# Patient Record
Sex: Female | Born: 1975 | Hispanic: No | Marital: Married | Smoking: Never smoker
Health system: Southern US, Community
[De-identification: ages and names within clinical notes are randomized; demographics above are authoritative.]

## PROBLEM LIST (undated history)

## (undated) DIAGNOSIS — D649 Anemia, unspecified: Secondary | ICD-10-CM

## (undated) DIAGNOSIS — O24419 Gestational diabetes mellitus in pregnancy, unspecified control: Secondary | ICD-10-CM

## (undated) DIAGNOSIS — Z789 Other specified health status: Secondary | ICD-10-CM

## (undated) DIAGNOSIS — I10 Essential (primary) hypertension: Secondary | ICD-10-CM

## (undated) DIAGNOSIS — Z30017 Encounter for initial prescription of implantable subdermal contraceptive: Secondary | ICD-10-CM

## (undated) HISTORY — DX: Encounter for initial prescription of implantable subdermal contraceptive: Z30.017

## (undated) HISTORY — DX: Anemia, unspecified: D64.9

## (undated) HISTORY — PX: NO PAST SURGERIES: SHX2092

## (undated) HISTORY — DX: Gestational diabetes mellitus in pregnancy, unspecified control: O24.419

---

## 1998-02-11 ENCOUNTER — Inpatient Hospital Stay (HOSPITAL_COMMUNITY): Admission: AD | Admit: 1998-02-11 | Discharge: 1998-02-15 | Payer: Self-pay | Admitting: *Deleted

## 1998-02-11 ENCOUNTER — Encounter (HOSPITAL_COMMUNITY): Admission: RE | Admit: 1998-02-11 | Discharge: 1998-02-13 | Payer: Self-pay | Admitting: *Deleted

## 2002-04-30 ENCOUNTER — Ambulatory Visit (HOSPITAL_COMMUNITY): Admission: RE | Admit: 2002-04-30 | Discharge: 2002-04-30 | Payer: Self-pay | Admitting: *Deleted

## 2002-08-17 ENCOUNTER — Emergency Department (HOSPITAL_COMMUNITY): Admission: EM | Admit: 2002-08-17 | Discharge: 2002-08-17 | Payer: Self-pay | Admitting: *Deleted

## 2002-09-15 ENCOUNTER — Inpatient Hospital Stay (HOSPITAL_COMMUNITY): Admission: AD | Admit: 2002-09-15 | Discharge: 2002-09-15 | Payer: Self-pay | Admitting: *Deleted

## 2002-09-17 ENCOUNTER — Inpatient Hospital Stay (HOSPITAL_COMMUNITY): Admission: AD | Admit: 2002-09-17 | Discharge: 2002-09-19 | Payer: Self-pay | Admitting: Obstetrics and Gynecology

## 2003-07-01 ENCOUNTER — Ambulatory Visit (HOSPITAL_COMMUNITY): Admission: RE | Admit: 2003-07-01 | Discharge: 2003-07-01 | Payer: Self-pay | Admitting: *Deleted

## 2003-08-25 ENCOUNTER — Ambulatory Visit (HOSPITAL_COMMUNITY): Admission: RE | Admit: 2003-08-25 | Discharge: 2003-08-25 | Payer: Self-pay | Admitting: *Deleted

## 2003-11-25 ENCOUNTER — Inpatient Hospital Stay (HOSPITAL_COMMUNITY): Admission: AD | Admit: 2003-11-25 | Discharge: 2003-11-27 | Payer: Self-pay | Admitting: *Deleted

## 2011-06-29 ENCOUNTER — Encounter (HOSPITAL_COMMUNITY): Payer: Self-pay | Admitting: *Deleted

## 2011-06-29 ENCOUNTER — Inpatient Hospital Stay (HOSPITAL_COMMUNITY)
Admission: AD | Admit: 2011-06-29 | Discharge: 2011-06-29 | Disposition: A | Discharge status: Home / Self Care | Payer: Self-pay | Source: Ambulatory Visit | Attending: Obstetrics & Gynecology | Admitting: Obstetrics & Gynecology

## 2011-06-29 ENCOUNTER — Inpatient Hospital Stay (HOSPITAL_COMMUNITY): Payer: Self-pay

## 2011-06-29 DIAGNOSIS — Z363 Encounter for antenatal screening for malformations: Secondary | ICD-10-CM | POA: Insufficient documentation

## 2011-06-29 DIAGNOSIS — Z8632 Personal history of gestational diabetes: Secondary | ICD-10-CM | POA: Insufficient documentation

## 2011-06-29 DIAGNOSIS — Z1389 Encounter for screening for other disorder: Secondary | ICD-10-CM | POA: Insufficient documentation

## 2011-06-29 DIAGNOSIS — O4690 Antepartum hemorrhage, unspecified, unspecified trimester: Secondary | ICD-10-CM | POA: Insufficient documentation

## 2011-06-29 DIAGNOSIS — O99891 Other specified diseases and conditions complicating pregnancy: Secondary | ICD-10-CM | POA: Insufficient documentation

## 2011-06-29 DIAGNOSIS — R109 Unspecified abdominal pain: Secondary | ICD-10-CM | POA: Insufficient documentation

## 2011-06-29 DIAGNOSIS — O9989 Other specified diseases and conditions complicating pregnancy, childbirth and the puerperium: Secondary | ICD-10-CM

## 2011-06-29 DIAGNOSIS — Z3490 Encounter for supervision of normal pregnancy, unspecified, unspecified trimester: Secondary | ICD-10-CM

## 2011-06-29 HISTORY — DX: Other specified health status: Z78.9

## 2011-06-29 LAB — URINALYSIS, ROUTINE W REFLEX MICROSCOPIC
Bilirubin Urine: NEGATIVE
Glucose, UA: NEGATIVE mg/dL
Ketones, ur: NEGATIVE mg/dL
Leukocytes, UA: NEGATIVE
Nitrite: NEGATIVE
Protein, ur: NEGATIVE mg/dL
Specific Gravity, Urine: 1.025 (ref 1.005–1.030)
Urobilinogen, UA: 0.2 mg/dL (ref 0.0–1.0)
pH: 6.5 (ref 5.0–8.0)

## 2011-06-29 LAB — CBC
HCT: 39.5 % (ref 36.0–46.0)
MCHC: 35.7 g/dL (ref 30.0–36.0)
MCV: 88.2 fL (ref 78.0–100.0)
RDW: 12.6 % (ref 11.5–15.5)

## 2011-06-29 LAB — URINE MICROSCOPIC-ADD ON

## 2011-06-29 LAB — POCT PREGNANCY, URINE: Preg Test, Ur: POSITIVE

## 2011-06-29 MED ORDER — OXYCODONE-ACETAMINOPHEN 5-325 MG PO TABS
1.0000 | ORAL_TABLET | Freq: Once | ORAL | Status: AC
  Administered 2011-06-29: 1 via ORAL
  Filled 2011-06-29: qty 1

## 2011-06-29 NOTE — Progress Notes (Signed)
+  UPT at [facility] on 7/18, had bleeding with wiping yesterday, now is dark brown.  LLQ pain since last night, intermittent, cramping.

## 2011-06-29 NOTE — ED Notes (Signed)
Unable to obtain FHR by either doppler or EFM.  Pt has letter from [facility] stating she had pos preg test on 7/18.  Pt unsure of LMP.

## 2011-06-29 NOTE — ED Notes (Signed)
Dr. Natale Milch in to discuss results with pt using [employer] interpreter [phone].

## 2011-06-29 NOTE — ED Provider Notes (Signed)
History     Chief Complaint  Patient presents with  . Vaginal Bleeding   Vaginal Bleeding This is a new problem. The current episode started yesterday. The problem has been gradually improving. The pain is moderate. The problem affects the left side. She is pregnant. Associated symptoms include abdominal pain. Pertinent negatives include no anorexia, back pain, chills, dysuria, flank pain, headaches or painful intercourse.    Past Medical History  Diagnosis Date  . No pertinent past medical history     Past Surgical History  Procedure Date  . No past surgeries     No family history on file.  History  Substance Use Topics  . Smoking status: Never Smoker   . Smokeless tobacco: Not on file  . Alcohol Use: No    Allergies: Allergies not on file  No prescriptions prior to admission    Review of Systems  Constitutional: Negative.  Negative for chills.  HENT: Negative.   Eyes: Negative.   Respiratory: Negative.   Cardiovascular: Negative.   Gastrointestinal: Positive for abdominal pain. Negative for anorexia.  Genitourinary: Negative.  Negative for dysuria and flank pain.  Musculoskeletal: Negative.  Negative for back pain.  Skin: Negative.   Neurological: Negative.  Negative for headaches.  Endo/Heme/Allergies: Negative.   Psychiatric/Behavioral: Negative.    Physical Exam   Blood pressure 126/79, pulse 80, temperature 98.6 F (37 C), temperature source Oral, resp. rate 18, height 5' (1.524 m), weight 165 lb 6.4 oz (75.025 kg), last menstrual period 01/11/2011.  Physical Exam  Constitutional: She is oriented to person, place, and time. She appears well-developed and well-nourished.  HENT:  Head: Normocephalic and atraumatic.  Eyes: Pupils are equal, round, and reactive to light.  Neck: Normal range of motion.  Cardiovascular: Normal rate, regular rhythm and intact distal pulses.  Exam reveals no gallop and no friction rub.   Murmur heard. Respiratory: Effort  normal and breath sounds normal. No respiratory distress. She has no wheezes.  GI: Soft. Bowel sounds are normal. She exhibits no distension and no mass. There is tenderness. There is guarding. There is no rebound.  Genitourinary: There is no rash, tenderness, lesion or injury on the right labia. There is no rash, tenderness, lesion or injury on the left labia. Uterus is tender. Cervix exhibits discharge. Cervix exhibits no motion tenderness and no friability. Right adnexum displays no tenderness. Left adnexum displays tenderness. There is bleeding around the vagina.  Musculoskeletal: Normal range of motion.  Neurological: She is alert and oriented to person, place, and time. She has normal reflexes.  Skin: Skin is warm and dry. No rash noted. She is not diaphoretic.  Psychiatric: She has a normal mood and affect.    MAU Course  Procedures  Results for orders placed during the hospital encounter of 06/29/11 (from the past 24 hour(s))  URINALYSIS, ROUTINE W REFLEX MICROSCOPIC     Status: Abnormal   Collection Time   06/29/11  2:15 PM      Component Value Range   Color, Urine YELLOW  YELLOW    Appearance CLEAR  CLEAR    Specific Gravity, Urine 1.025  1.005 - 1.030    pH 6.5  5.0 - 8.0    Glucose, UA NEGATIVE  NEGATIVE (mg/dL)   Hgb urine dipstick LARGE (*) NEGATIVE    Bilirubin Urine NEGATIVE  NEGATIVE    Ketones, ur NEGATIVE  NEGATIVE (mg/dL)   Protein, ur NEGATIVE  NEGATIVE (mg/dL)   Urobilinogen, UA 0.2  0.0 - 1.0 (  mg/dL)   Nitrite NEGATIVE  NEGATIVE    Leukocytes, UA NEGATIVE  NEGATIVE   URINE MICROSCOPIC-ADD ON     Status: Abnormal   Collection Time   06/29/11  2:15 PM      Component Value Range   Squamous Epithelial / LPF FEW (*) RARE    RBC / HPF 0-2  <3 (RBC/hpf)   Bacteria, UA FEW (*) RARE   POCT PREGNANCY, URINE     Status: Normal   Collection Time   06/29/11  3:29 PM      Component Value Range   Preg Test, Ur POSITIVE    HCG, QUANTITATIVE, PREGNANCY     Status: Abnormal    Collection Time   06/29/11  3:43 PM      Component Value Range   hCG, Beta Chain, Sharene Butters, [place] [address] (*) <5 (mIU/mL)  CBC     Status: Abnormal   Collection Time   06/29/11  3:43 PM      Component Value Range   WBC 9.6  4.0 - 10.5 (K/uL)   RBC 4.48  3.87 - 5.11 (MIL/uL)   Hemoglobin 14.1  12.0 - 15.0 (g/dL)   HCT 60.4  54.0 - 98.1 (%)   MCV 88.2  78.0 - 100.0 (fL)   MCH 31.5  26.0 - 34.0 (pg)   MCHC 35.7  30.0 - 36.0 (g/dL)   RDW 19.1  47.8 - 29.5 (%)   Platelets 148 (*) 150 - 400 (K/uL)  ABO/RH     Status: Normal   Collection Time   06/29/11  3:44 PM      Component Value Range   ABO/RH(D) O POS    WET PREP, GENITAL     Status: Abnormal   Collection Time   06/29/11  3:50 PM      Component Value Range   Yeast, Wet Prep NONE SEEN  NONE SEEN    Trich, Wet Prep NONE SEEN  NONE SEEN    Clue Cells, Wet Prep NONE SEEN  NONE SEEN    WBC, Wet Prep HPF POC MODERATE (*) NONE SEEN     Microscopic wet-mount exam shows white blood cells.  Assessment and Plan  1. IUP at 11 wks per US performed today, with [facility] 01/12/2012. 2. Vaginal Bleeding during pregnancy 3. 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 Radiology reassured me that no placental abnormalities were seen on [place]. Pt to cont with plans to speak to HD regarding appointment for routine care, and I stressed through interpretor that she needs to be sure to tell HD that she has history of GDM and will need an early screen. Pt v/u of this but expressed her concern that she has had difficulty getting into contact with the HD. I left a message for our clinic manager to remind me on Monday to call them myself. Pt v/u and appreciates this.  Jade Potts 06/29/2011, 3:43 PM

## 2011-06-30 LAB — GC/CHLAMYDIA PROBE AMP, GENITAL: Chlamydia, DNA Probe: NEGATIVE

## 2011-07-11 ENCOUNTER — Ambulatory Visit (INDEPENDENT_AMBULATORY_CARE_PROVIDER_SITE_OTHER): Payer: Self-pay | Admitting: Advanced Practice Midwife

## 2011-07-11 DIAGNOSIS — R82998 Other abnormal findings in urine: Secondary | ICD-10-CM

## 2011-07-11 DIAGNOSIS — Z8632 Personal history of gestational diabetes: Secondary | ICD-10-CM

## 2011-07-11 DIAGNOSIS — Z331 Pregnant state, incidental: Secondary | ICD-10-CM

## 2011-07-11 DIAGNOSIS — O24419 Gestational diabetes mellitus in pregnancy, unspecified control: Secondary | ICD-10-CM | POA: Insufficient documentation

## 2011-07-11 DIAGNOSIS — R8271 Bacteriuria: Secondary | ICD-10-CM | POA: Insufficient documentation

## 2011-07-11 LAB — POCT URINALYSIS DIP (DEVICE)
Glucose, UA: NEGATIVE mg/dL
Nitrite: NEGATIVE
Urobilinogen, UA: 1 mg/dL (ref 0.0–1.0)

## 2011-07-11 NOTE — Patient Instructions (Signed)
Embarazo - Segundo trimestre (Pregnancy - Second Trimester) El segundo trimestre del embarazo (del 3 al 6mes) es un perodo de evolucin rpida para usted y el beb. Hacia el final del sexto mes, el beb mide aproximadamente 23 cm y pesa 680 g. Comenzar a sentir los movimientos del beb entre las 18 y las 20 semanas de embarazo. Podr sentir las pataditas ("quickening en ingls"). Hay un rpido aumento de peso. Puede segregar un lquido claro (calostro) de las mamas. Quizs sienta pequeas contracciones en el vientre (tero) Esto se conoce como falso trabajo de parto o contracciones de Braxton-Hicks. Es como una prctica del trabajo de parto que se produce cuando el beb est listo para salir. Generalmente los problemas de vmitos matinales ya se han superado hacia el final del primer trimestre. Algunas mujeres desarrollan pequeas manchas oscuras (que se denominan cloasma, mscara del embarazo) en la cara que normalmente se van luego del nacimiento del beb. La exposicin al sol empeora las manchas. Puede desarrollarse acn en algunas mujeres embarazadas, y puede desaparecer en aquellas que ya tienen acn. EXAMENES PRENATALES  Durante los exmenes prenatales, deber seguir realizando pruebas de sangre, segn avance el embarazo. Estas pruebas se realizan para controlar su salud y la del beb. Tambin se realizan anlisis de sangre para conocer los niveles de hemoglobina. La anemia (bajo nivel de hemoglobina) es frecuente durante el embarazo. Para prevenirla, se administran hierro y vitaminas. Tambin se le realizarn exmenes para saber si tiene diabetes entre las 24 y las 28 semanas del embarazo. Podrn repetirle algunas de las pruebas que le hicieron previamente.  En cada visita le medirn el tamao del tero. Esto se realiza para asegurarse de que el beb est creciendo correctamente de acuerdo al estado del embarazo.  Tambin en cada visita prenatal controlarn su presin arterial. Esto se realiza  para asegurarse de que no tenga toxemia.  Se controlar su orina para asegurarse de que no tenga infecciones, diabetes o protena en la orina.  Se controlar su peso regularmente para asegurarse que el aumento ocurre al ritmo indicado. Esto se hace para asegurarse que usted y el beb tienen una evolucin normal.  En algunas ocasiones se realiza una prueba de ultrasonido para confirmar el correcto desarrollo y evolucin del beb. Esta prueba se realiza con ondas sonoras inofensivas para el beb, de modo que el profesional pueda calcular ms precisamente la fecha del parto. Algunas veces se realizan pruebas especializadas del lquido amnitico que rodea al beb. Esta prueba se denomina amniocentesis. El lquido amnitico se obtiene introduciendo una aguja en el vientre (abdomen). Se realiza para controlar los cromosomas en aquellos casos en los que existe alguna preocupacin acerca de algn problema gentico que pueda sufrir el beb. En ocasiones se lleva a cabo cerca del final del embarazo, si es necesario inducir al parto. En este caso se realiza para asegurarse que los pulmones del beb estn lo suficientemente maduros como para que pueda vivir fuera del tero. CAMBIOS QUE OCURREN EN EL SEGUNDO TRIMESTRE DEL EMBARAZO Su organismo atravesar numerosos cambios durante el embarazo. Estos pueden variar de una persona a otra. Converse con el profesional que la asiste acerca los cambios que usted note y que la preocupen.  Durante el segundo trimestre probablemente sienta un aumento del apetito. Es normal tener "antojos" de ciertas comidas. Esto vara de una persona a otra y de un embarazo a otro.  El abdomen inferior comenzar a abultarse.  Podr tener la necesidad de orinar con ms frecuencia debido a que   el tero y el beb presionan sobre la vejiga. Tambin es frecuente contraer ms infecciones urinarias durante el embarazo (dolor al orinar). Puede evitarlas bebiendo gran cantidad de lquidos y vaciando  la vejiga antes y despus de mantener relaciones sexuales.  Podrn aparecer las primeras estras en las caderas, abdomen y mamas. Estos son cambios normales del cuerpo durante el embarazo. No existen medicamentos ni ejercicios que puedan prevenir estos cambios.  Es posible que comience a desarrollar venas inflamadas y abultadas (varices) en las piernas. El uso de medias de descanso, elevar sus pies durante 15 minutos, 3 a 4 veces al da y limitar la sal en su dieta ayuda a aliviar el problema.  Podr sentir acidez gstrica a medida que el tero crece y presiona contra el estmago. Puede tomar anticidos, con la autorizacin de su mdico, para aliviar este problema. Tambin es til ingerir pequeas comidas 4 a 5 veces al da.  La constipacin puede tratarse con un laxante o agregando fibra a su dieta. Beber grandes cantidades de lquidos, comer vegetales, frutas y granos integrales es de gran ayuda.  Tambin es beneficioso practicar actividad fsica. Si ha sido una persona activa hasta el embarazo, podr continuar con la mayora de las actividades durante el mismo. Si ha sido menos activa, puede ser beneficioso que comience con un programa de ejercicios, como realizar caminatas.  Puede desarrollar hemorroides (vrices en el recto) hacia el final del segundo trimestre. Tomar baos de asiento tibios y utilizar cremas recomendadas por el profesional que lo asiste sern de ayuda para los problemas de hemorroides.  Tambin podr sentir dolor de espalda durante este momento de su embarazo. Evite levantar objetos pesados, utilice zapatos de taco bajo y mantenga una buena postura para ayudar a reducir los problemas de espalda.  Algunas mujeres embarazadas desarrollan hormigueo y adormecimiento de la mano y los dedos debido a la hinchazn y compresin de los ligamentos de la mueca (sndrome del tnel carpiano). Esto desaparece una vez que el beb nace.  Como sus pechos se agrandan, necesitar un sujetador  ms grande. Use un sostn de soporte, cmodo y de algodn. No utilice un sostn para amamantar hasta el ltimo mes de embarazo si va a amamantar al beb.  Podr observar una lnea oscura desde el ombligo hacia la zona pbica denominada linea nigra.  Podr observar que sus mejillas se ponen coloradas debido al aumento de flujo sanguneo en la cara.  Podr desarrollar "araitas" en la cara, cuello y pecho. Esto desaparece una vez que el beb nace. INSTRUCCIONES PARA EL CUIDADO DOMICILIARIO  Es extremadamente importante que evite el cigarrillo, hierbas medicinales, alcohol y las drogas no prescriptas durante el embarazo. Estas sustancias qumicas afectan la formacin y el desarrollo del beb. Evite estas sustancias durante todo el embarazo para asegurar el nacimiento de un beb sano.  La mayor parte de los cuidados que se aconsejan son los mismos que los indicados para el primer trimestre del embarazo. Cumpla con las citas tal como se le indic. Siga las instrucciones del profesional que lo asiste con respecto al uso de los medicamentos, el ejercicio y la dieta.  Durante el embarazo debe obtener nutrientes para usted y para su beb. Consuma alimentos balanceados a intervalos regulares. Elija alimentos como carne, pescado, leche y otros productos lcteos descremados, vegetales, frutas, panes integrales y cereales. El profesional le informar cul es el aumento de peso ideal.  Las relaciones sexuales fsicas pueden continuarse hasta cerca del fin del embarazo si no existen otros problemas. Estos   problemas pueden ser la prdida temprana (prematura) de lquido amnitico de las membranas, sangrado vaginal, dolor abdominal u otros problemas mdicos o del embarazo.  Realice actividad fsica todos los das, si no tiene restricciones. Consulte con el profesional que la asiste si no sabe con certeza si determinados ejercicios son seguros. El mayor aumento de peso tiene lugar durante los ltimos 2 trimestres del  embarazo. El ejercicio la ayudar a:  Controlar su peso.  Ponerla en forma para el parto.  Ayudarla a perder peso luego de haber dado a luz.  Use un buen sostn o como los que se usan para hacer deportes para aliviar la sensibilidad de las mamas. Tambin puede serle til si lo usa mientras duerme. Si pierde calostro, podr utilizar apsitos en el sostn.  No utilice la baera con agua caliente, baos turcos y saunas durante el embarazo.  Utilice el cinturn de seguridad sin excepcin cuando conduzca. Este la proteger a usted y al beb en caso de accidente.  Evite comer carne cruda, queso crudo, y el contacto con los utensilios y desperdicios de los gatos. Estos elementos contienen grmenes que pueden causar defectos de nacimiento en el beb.  El segundo trimestre es un buen momento para visitar a su dentista y evaluar su salud dental si an no lo ha hecho. Es importante mantener los dientes limpios. Utilice un cepillo de dientes blando. Cepllese ms suavemente durante el embarazo.  Es ms fcil perder algo de orina durante el embarazo. Apretar y fortalecer los msculos de la pelvis la ayudar con este problema. Practique detener la miccin cuando est en el bao. Estos son los mismos msculos que necesita fortalecer. Son tambin los mismos msculos que utiliza cuando trata de evitar los gases. Puede practicar apretando estos msculos 10 veces, y repetir esto 3 veces por da aproximadamente. Una vez que conozca qu msculos debe apretar, no realice estos ejercicios durante la miccin. Puede favorecerle una infeccin si la orina vuelve hacia atrs.  Pida ayuda si tiene necesidades econmicas, de asesoramiento o nutricionales durante el embarazo. El profesional podr ayudarla con respecto a estas necesidades, o derivarla a otros especialistas.  La piel puede ponerse grasa. Si esto sucede, lvese la cara con un jabn suave, utilice un humectante no graso y maquillaje con base de aceite o  crema. CONSUMO DE MEDICAMENTOS Y DROGAS DURANTE EL EMBARAZO  Contine tomando las vitaminas apropiadas para esta etapa tal como se le indic. Las vitaminas deben contener un miligramo de cido flico y deben suplementarse con hierro. Guarde todas las vitaminas fuera del alcance de los nios. La ingestin de slo un par de vitaminas o tabletas que contengan hierro puede ocasionar la muerte en un beb o en un nio pequeo.  Evite el uso de medicamentos, inclusive los de venta libre y hierbas que no hayan sido prescriptos o indicados por el profesional que la asiste. Algunos medicamentos pueden causar problemas fsicos al beb. Utilice los medicamentos de venta libre o de prescripcin para el dolor, el malestar o la fiebre, segn se lo indique el profesional que lo asiste. No utilice aspirina.  El consumo de alcohol est relacionado con ciertos defectos de nacimiento. Esto incluye el sndrome de alcoholismo fetal. Debe evitar el consumo de alcohol en cualquiera de sus formas. El cigarrillo causa nacimientos prematuros y bebs de bajo peso. El uso de drogas recreativas est absolutamente prohibido. Son muy nocivas para el beb. Un beb que nace de una madre adicta, ser adicto al nacer. Ese beb tendr los mismos   causa nacimientos prematuros y bebs de [place]. El uso de drogas recreativas est absolutamente prohibido. Son muy nocivas para el beb. Un beb que nace de [employer], ser adicto al nacer. Ese beb tendr los mismos sntomas de abstinencia que un adulto.   Infrmele al profesional si consume alguna droga.   No consuma drogas ilegales. Pueden causarle mucho dao al beb.  SOLICITE ATENCIN MDICA SI:  Tiene preguntas o preocupaciones durante su embarazo. Es mejor que llame para Science writer las dudas que esperar hasta su prxima visita prenatal. Thressa Sheller forma se sentir ms tranquila.  SOLICITE ATENCIN MDICA DE INMEDIATO SI:  La temperatura oral se eleva sin motivo por encima de 101.0 o segn le indique el profesional que lo asiste.   Tiene una prdida de lquido por la vagina (canal de parto). Si sospecha una ruptura de las [place], tmese la temperatura y  llame al profesional para informarlo sobre esto.   Observa unas pequeas manchas, una hemorragia vaginal o elimina cogulos. Notifique al profesional acerca de la cantidad y de cuntos apsitos est utilizando. Unas pequeas manchas de sangre son algo comn durante el Psychiatrist, especialmente despus de Sales promotion account executive.   Presenta un olor desagradable en la secrecin vaginal y observa un cambio en el color, de transparente a blanco.   Contina con las nuseas y no obtiene alivio de los remedios indicados. Vomita sangre o algo similar a la borra del caf.   [place] o sube ms de 900 g. en una semana, o segn lo indicado por el profesional que la asiste.   Observa que se le [employer], las manos, los pies o las piernas.   Ha estado expuesta a la rubola y no ha sufrido la enfermedad.   Ha estado expuesta a la quinta enfermedad o a la varicela.   Presenta dolor abdominal. Las molestias en el ligamento redondo son Neomia Dear causa no cancerosa (benigna) frecuente de dolor abdominal durante el embarazo. El profesional que la asiste deber evaluarla.   Presenta dolor de cabeza intenso que no se [place].   Presenta fiebre, diarrea, dolor al orinar o le falta la respiracin.   Presenta dificultad para ver, visin borrosa, o visin doble.   Sufre una cada, un accidente de trnsito o cualquier tipo de trauma.   Vive en un hogar en el que existe violencia fsica o mental.  Document Released: 09/05/2005 Document Re-Released: 02/20/2010 [facility] Patient Information 2011 [place], [place].

## 2011-07-11 NOTE — Progress Notes (Signed)
   Subjective:    Jade Potts is a [id number] Unknown being seen today for her first obstetrical visit.  Her obstetrical history is significant for GDM with first pregnancy. Pregnancy history fully reviewed.  Patient is without complaints. Normal pap last year.   Filed Vitals:   07/11/11 0825 07/11/11 0829  BP: 122/83   Temp: 97.4 F (36.3 C)   Height:  5' 0.75" (1.543 m)  Weight: 165 lb 12.8 oz (75.206 kg)     HISTORY: OB History    Grav Para Term Preterm Abortions TAB SAB Ect Mult Living   4 3 3  0 0 0 0 0 0 3     # Outc Date GA Lbr Len/2nd Wgt Sex Del Anes PTL Lv   1 TRM 3/99 [redacted]w[redacted]d 18:00 6lb11oz(3.033kg) M VAC EPI No Yes   Comments: Induced/ Pt ran high fever   2 TRM 11/03 [redacted]w[redacted]d 08:00 8lb(3.629kg) M SVD EPI No Yes   3 TRM 12/04 [redacted]w[redacted]d 05:00 7lb(3.175kg) F SVD EPI No Yes   4 CUR              Past Medical History  Diagnosis Date  . No pertinent past medical history   . Gestational diabetes     1st and 2nd pregnancy  . Anemia     1st pregnancy   History reviewed. No pertinent past surgical history. Family History  Problem Relation Age of Onset  . Diabetes Mother   . Hypertension Sister      Exam    Uterine Size: size equals dates  Pelvic Exam: Completed during recent MAU visit  System:     Skin: normal coloration and turgor, no rashes    Neurologic: oriented, normal   Extremities: normal strength, tone, and muscle mass   Cardiovascular: regular rate and rhythm   Respiratory:  appears well, vitals normal, no respiratory distress, acyanotic, normal RR, neck free of mass or lymphadenopathy, chest clear, no wheezing, crepitations, rhonchi, normal symmetric air entry   Abdomen: soft, non-tender; bowel sounds normal; no masses,  no organomegaly      Assessment:    Pregnancy: [id number] Patient Active Problem List  Diagnoses  . Normal pregnancy  . Hx gestational diabetes  . Asymptomatic bacteriuria        Plan:    35 y.o.[id number] at [redacted]w[redacted]d  Initial labs  drawn, early 1 hour GCT today Prenatal vitamins. Problem list reviewed and updated. Genetic Screening discussed Quad Screen: undecided.  Ultrasound discussed; fetal survey: to be scheduled at approprate EGA.  Follow up in 4 weeks.   Jade Potts 07/11/2011

## 2011-07-11 NOTE — Progress Notes (Signed)
Pt came to MAU on the 20th for bleeding. No bleeding at this time. No vaginal discharge. Used interpreter Roxanna Mew.

## 2011-07-12 LAB — OBSTETRIC PANEL
Antibody Screen: NEGATIVE
Basophils Relative: 0 % (ref 0–1)
HCT: 43.5 % (ref 36.0–46.0)
Hemoglobin: 14.4 g/dL (ref 12.0–15.0)
MCH: 31 pg (ref 26.0–34.0)
MCHC: 33.1 g/dL (ref 30.0–36.0)
Monocytes Absolute: 0.3 10*3/uL (ref 0.1–1.0)
Monocytes Relative: 4 % (ref 3–12)
Neutro Abs: 5.2 10*3/uL (ref 1.7–7.7)
Rh Type: POSITIVE
Rubella: >500 IU/mL — ABNORMAL HIGH

## 2011-07-12 LAB — HIV ANTIBODY (ROUTINE TESTING W REFLEX): HIV: NONREACTIVE

## 2011-07-13 LAB — CULTURE, OB URINE

## 2011-07-16 LAB — HEMOGLOBINOPATHY EVALUATION
Hemoglobin Other: 0 % (ref 0.0–0.0)
Hgb A: 97 % (ref 96.8–97.8)

## 2011-08-08 ENCOUNTER — Ambulatory Visit (INDEPENDENT_AMBULATORY_CARE_PROVIDER_SITE_OTHER): Payer: Self-pay | Admitting: Obstetrics and Gynecology

## 2011-08-08 VITALS — BP 110/75 | Temp 97.7°F | Wt 165.7 lb

## 2011-08-08 DIAGNOSIS — Z3480 Encounter for supervision of other normal pregnancy, unspecified trimester: Secondary | ICD-10-CM

## 2011-08-08 LAB — POCT URINALYSIS DIP (DEVICE)
Hgb urine dipstick: NEGATIVE
Ketones, ur: NEGATIVE mg/dL
Specific Gravity, Urine: 1.025 (ref 1.005–1.030)
pH: 7.5 (ref 5.0–8.0)

## 2011-08-08 NOTE — Progress Notes (Signed)
This patient has no complaints except continual nausea almost every day. This seems to be helped by sucking on lemon. As of the time this is not accompanied by vomiting. When it is is usually in the morning and just green mucus. The patient has been encouraged to take something dry by mouth in the morning before she takes a liquids. Was a gestational diabetic with her first 2 pregnancies but not with her last period she has no other complaints and will return in one month

## 2011-08-08 NOTE — Progress Notes (Signed)
Pulse-68.  Vaginal discharge white.

## 2011-09-05 ENCOUNTER — Ambulatory Visit (INDEPENDENT_AMBULATORY_CARE_PROVIDER_SITE_OTHER): Payer: Self-pay | Admitting: Family Medicine

## 2011-09-05 DIAGNOSIS — Z331 Pregnant state, incidental: Secondary | ICD-10-CM

## 2011-09-05 DIAGNOSIS — B373 Candidiasis of vulva and vagina: Secondary | ICD-10-CM

## 2011-09-05 LAB — POCT URINALYSIS DIP (DEVICE)
Bilirubin Urine: NEGATIVE
Glucose, UA: NEGATIVE mg/dL
Hgb urine dipstick: NEGATIVE
Ketones, ur: NEGATIVE mg/dL
Nitrite: NEGATIVE
Protein, ur: NEGATIVE mg/dL
Specific Gravity, Urine: 1.02 (ref 1.005–1.030)
Urobilinogen, UA: 0.2 mg/dL (ref 0.0–1.0)
pH: 8 (ref 5.0–8.0)

## 2011-09-05 MED ORDER — FLUCONAZOLE 150 MG PO TABS: 150.0000 mg | ORAL_TABLET | Freq: Once | ORAL | Status: AC

## 2011-09-05 NOTE — Progress Notes (Signed)
C/o of vaginal itch.

## 2011-09-05 NOTE — Patient Instructions (Signed)
Infeccin por cndida en adultos (Candida Infection In Adults) Una infeccin por Cndida (tambin denominada levadura, hongo y Monilia) es un crecimiento excesivo de levadura que puede ocurrir en cualquier parte del cuerpo. Una infeccin por levaduras ocurre comnmente en zonas clidas y hmedas del cuerpo. Por lo general, la infeccin permanece localizada, pero puede extenderse a convertirse en una infeccin sistmica. Una infeccin de la levadura puede ser un signo de una enfermedad ms grave como la diabetes, la leucemia o el SIDA. Una infeccin por hongos puede que puede estar presente en mujeres y hombres. En mujeres, la vaginitis Cndida es una infeccin vaginal. Se trata de una de las causas ms frecuentes de la vaginitis. Los hombres no suelen tener sntomas [employer] se le aparecen otros problemas. Pueden descubrir que tienen una infeccin por hongos porque su compaera sexual los tiene. Es ms probable que los hombres no circuncisos adquieran una infeccin por hongo que aquellos que estn circuncindados. Esto se debe a que el glande est expuesto al aire y esta seco. CAUSAS Mujeres  Antibiticos.  Medicamento con esteroides Network engineer.   Tener sobrepeso (obesidad).   Diabetes.   Sistema inmune deficiente.   Ciertas enfermedades.   Medicamentos inmunosupresores para pacientes con trasplante de rganos.  Quimioterapia.   El Prien.   Menstruacin.   Estrs o fatiga.   El uso de drogas por va intravenosa.   Anticonceptivos orales.  Utilizar ropa ajustada en la zona de la entrepierna.   Contagio a partir de un compaero sexual que ya tiene la infeccin.   Los espermicidas.   Intravenosa, urinarias o catteres otros.   Hombres  Contagio a partir de una compaera sexual que ya tiene la infeccin.  Tener sexo oral o anal con una persona que tiene la infeccin.   Los espermicidas.   Diabetes.  Antibiticos.   Sistema inmune deficiente.   Medicamentos  que suprimen el sistema inmune.   El uso de drogas por va intravenosa.  Intravenosa, urinarias o catteres otros.   SNTOMAS Mujeres  Flujo vaginal espeso y blanco.  Picazn vaginal.   Enrojecimiento e hinchazn en la zona de la vagina.   Irritacin de los labios de la vagina y perineo.   lceras en labios vaginales y perineo.  Relaciones sexuales dolorosas.   Bajo nivel de Banker (hipoglucemia).   Dolor al Beatrix Shipper.   Infecciones en la vejiga.  Problemas intestinales como constipacin, indigestin, mal aliento, hinchazn, gases, diarrea o heces blandas.   Hombres  Primero los hombres desarrollan problemas intestinales como constipacin, indigestin, mal aliento, hinchazn, gases, diarrea o heces blandas.  Piel del pene seca y [place] con picazn o molestias.  Prurito de Estate agent.   Piel seca y escamosa.   Pie de atleta.  Hipoglucemia.   DIAGNSTICO Mujeres  Se revisa el historial y se realiza un anlisis.   La supuracin se observa con un microscopio.   Deber tomarse un cultivo de Administrator, arts.  Hombres  Se revisa el historial y se realiza un anlisis.   Se observar cualquier secrecin proveniente del pene y la piel quebrada en el microscopio y se Education officer, environmental un cultivo.   Podrn realizarle cultivos de heces.  TRATAMIENTO Mujeres  Supositorios y cremas antifngicos vaginales.  Cremas medicadas para disminuir la picazn e irritacin de la zona externa de la vagina.   Aplique una bolsa caliente en la zona del perineo para disminuir molestias o inflamacin.   Medicamentos antifngicos orales.   Medicados supositorios vaginales o  crema para infecciones recurrentes.   Lave y seque la zona por completo antes de Comptroller.  La ingesta de yogur con lactobacillus le ayudar con la prevencin y [place].   Si las cremas y supositorios no funcionan, la aplicacin de solucin violeta de genciana puede ayudar.   Hombres  Cremas  antifngicas y antifngicos orales.  A veces el tratamiento deber continuar por 30 das despus de que los sntomas desaparecen para prevenir la recurrencia.  INSTRUCCIONES PARA EL CUIDADO DOMICILIARIO Mujeres  Utilice ropa interior de algodn y evite las ropas ajustadas.  Evite el papel higinico de color o perfumado y los tampones o toallitas con desodorante.   No utilice duchas vaginales.   Mantenga la diabetes bajo control.   Tome todos los medicamentos tal como se le indic.   Mantenga su piel limpia y [place].   Consuma leche o yogur con lactobacillus de [place] regular. Si contrae infecciones por levaduras frecuentes y cree saber cul es la infeccin, existen medicamentos de [place]. Si la infeccin no parece curarse en 3 das, hable con el mdico.  Comunique a su compaero sexual que padece una infeccin por hongos. El tambin Network engineer, en especial si la infeccin no desaparece o es recurrente.   Hombres  Mantenga su piel limpia y [place].  Mantenga la diabetes bajo control.   Tome todos los medicamentos tal como se le indic.   Comunique a su compaera sexual que sufre una infeccin por hongos.  SOLICITE ANTENCIN MDICA SI:  Los sntomas no desaparecen o empeoran luego de 1 semana de tratamiento.   Usted tiene una temperatura oral de ms de 100.4 F.   Usted tiene dificultad para tragar o comer por un tiempo prolongado.   Aparecen ampollas en los genitales.   Si aparece una hemorragia vaginal y no es el momento del perodo.   Siente dolor abdominal.   Presentara problemas intestinales como los ya descritos.   Si se siente dbil o desfalleciente.   Usted tiene dolor al orinar o aumento.   Tiene dolor durante las [employer].  ASEGRESE DE QUE:  Comprende esas instrucciones para el alta mdica.   Controlar su enfermedad.   Pedir ayuda de inmediato si no mejora o empeora.  Document Released: 11/26/2005 Document Re-Released:  05/16/2010 [facility] Patient Information 2011 [place], [place].Dolor del ligamento redondo (Round Ligament Pain) El ligamento redondo se compone de msculo y tejido fibroso. Est unido al tero cerca de las trompas de [place] El ligamento redondo est ubicado en ambos lados del tero y [place] a Pharmacologist su posicin. Normalmente comienza en el segundo trimestre del embarazo cuando el tero sale hacia afuera de la pelvis. El dolor puede aparecer y desaparecer hasta el nacimiento del beb. El dolor de ligamento redondo no es un problema serio y no ocasiona daos al beb. CAUSAS Durante el Consulting civil engineer tero crece mayormente desde el segundo trimestre [place]. A medida que crece, se estira y tuerce ligeramente los ligamentos. Cuando el tero ejerce presin en ambos lados, el ligamento redondo del lado opuesto presiona y se Psychologist, occupational. Esto causa dolor. SNTOMAS El dolor puede ocurrir en uno o ambos lados. El dolor es por lo general como un pellizco corto y filoso. A veces puede ser un dolor opaco y persistente. Se siente en la parte baja del abdomen o en la ingle. Es un Adult nurse, y por lo general comienza en la ingle y se mueve hacia la zona de la cadera. El dolor puede  ocurrir con:  Cambio de posicin repentino como el levantarse de la cama o de una silla.  Darse vuelta en la cama.   Toser o estornudar.  Caminar demasiado.   Cualquier tipo de actividad fsica.   DIAGNSTICO El medico deber asegurarse de que no existen problemas graves que Audiological scientist. Si no encuentra nada grave, los sntomas suelen indicar de que se trata de un dolor proveniente del ligamento redondo. TRATAMIENTO  Sintese y reljese cuando el dolor comience.   Llevar las rodillas [employer].   Recustese sobre un lado con una almohada debajo del vientre (abdomen) y Oletta Lamas sus piernas.   Sintese en un bao caliente de 15 a 20 minutos o hasta que el dolor desaparezca.  INSTRUCCIONES PARA EL CUIDADO  DOMICILIARIO  Utilice los medicamentos de venta libre o de prescripcin para Chief Technology Officer, el malestar o la [place], segn se lo indique el profesional que lo asiste.   Sintese y pngase de pie lentamente.   Evite caminatas largas si le ocasionan dolor.   Detenga o disminuya las actividades fsicas si Sports administrator.  SOLICITE ATENCIN MDICA SI:  El dolor no desaparece con estas medidas.   Necesita que le prescriban medicamentos ms fuertes para Chief Technology Officer.   Desarrolla un dolor de espalda que no haba sentido antes junto con el de lado.  SOLICITE ATENCIN MDICA DE INMEDIATO SI:  La temperatura se eleva por encima de 100.4 F o ms.   Siente contracciones uterinas.   Presenta hemorragia vaginal.   Presenta nuseas, diarrea o vmitos.   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  Document Released: 11/08/2008 Document Re-Released: 04/14/2009 [facility] Patient Information 2011 [place], [place].

## 2011-09-05 NOTE — Progress Notes (Signed)
Subjective:    Jade Potts is a 35 y.o. female being seen today for her obstetrical visit. She is at [redacted]w[redacted]d gestation. Patient reports vaginal irritation, itching and discharge. Also reports pain on the Left side when walking, relieved with rest. Fetal movement: normal. She is accompanied today by an interpretor from [facility].  Menstrual History: OB History    Grav Para Term Preterm Abortions TAB SAB Ect Mult Living   4 3 3  0 0 0 0 0 0 3       Objective:    BP 125/82  Pulse 89  Temp 97.6 F (36.4 C)  Wt 167 lb 4.8 oz (75.887 kg) FHT: 150 BPM  Uterine Size: 22 cm   Pelvic Exam: White discharge, curdlike noted; pt with moderate erythema/irritation noted of labia majora. Cervical os closed, long, posterior. No bleeding. Slide showed yeast.   Assessment:    Pregnancy 21 and 4/7 weeks  Vaginal Candidiasis Round Ligament Pain  Plan:    Signs and symptoms of preterm labor: discussed. Wet prep and GC/Ch sent. Diflucan 150 mg x 1 PO prescribed. Anatomy US scheduled today. Follow up in 4 weeks.

## 2011-09-06 ENCOUNTER — Ambulatory Visit (HOSPITAL_COMMUNITY)
Admission: RE | Admit: 2011-09-06 | Discharge: 2011-09-06 | Disposition: A | Discharge status: Home / Self Care | Payer: Self-pay | Source: Ambulatory Visit | Attending: Family Medicine | Admitting: Family Medicine

## 2011-09-06 DIAGNOSIS — Z1389 Encounter for screening for other disorder: Secondary | ICD-10-CM | POA: Insufficient documentation

## 2011-09-06 DIAGNOSIS — O358XX0 Maternal care for other (suspected) fetal abnormality and damage, not applicable or unspecified: Secondary | ICD-10-CM | POA: Insufficient documentation

## 2011-09-06 DIAGNOSIS — Z363 Encounter for antenatal screening for malformations: Secondary | ICD-10-CM | POA: Insufficient documentation

## 2011-09-06 LAB — WET PREP, GENITAL
Clue Cells Wet Prep HPF POC: NONE SEEN
Trich, Wet Prep: NONE SEEN
WBC, Wet Prep HPF POC: NONE SEEN
Yeast Wet Prep HPF POC: NONE SEEN

## 2011-10-03 ENCOUNTER — Ambulatory Visit (INDEPENDENT_AMBULATORY_CARE_PROVIDER_SITE_OTHER): Payer: Self-pay | Admitting: Obstetrics and Gynecology

## 2011-10-03 ENCOUNTER — Other Ambulatory Visit: Payer: Self-pay | Admitting: Obstetrics and Gynecology

## 2011-10-03 VITALS — BP 115/76 | Temp 98.0°F | Wt 170.2 lb

## 2011-10-03 DIAGNOSIS — Z331 Pregnant state, incidental: Secondary | ICD-10-CM

## 2011-10-03 DIAGNOSIS — Z23 Encounter for immunization: Secondary | ICD-10-CM

## 2011-10-03 LAB — POCT URINALYSIS DIP (DEVICE)
Bilirubin Urine: NEGATIVE
Glucose, UA: NEGATIVE mg/dL
Leukocytes, UA: NEGATIVE
Nitrite: NEGATIVE
Urobilinogen, UA: 0.2 mg/dL (ref 0.0–1.0)

## 2011-10-03 MED ORDER — INFLUENZA VIRUS VACC SPLIT PF IM SUSP
0.5000 mL | Freq: Once | INTRAMUSCULAR | Status: AC
  Administered 2011-10-03: 0.5 mL via INTRAMUSCULAR

## 2011-10-03 NOTE — Progress Notes (Signed)
This patient is a 35 year old Hispanic Spanish-speaking female gravida 4 para 27 with her youngest child 7 years old. Her only complaint is some heartburn and nausea Lendell Caprice try her on Zantac. Other than that the baby is moving well all other Shawnie Pons. Parameters are fine. She'll return next 3 weeks for GTT.

## 2011-10-03 NOTE — Progress Notes (Signed)
P=87, Used Interpreter [employer]

## 2011-10-24 ENCOUNTER — Ambulatory Visit (INDEPENDENT_AMBULATORY_CARE_PROVIDER_SITE_OTHER): Payer: Self-pay | Admitting: Family Medicine

## 2011-10-24 ENCOUNTER — Other Ambulatory Visit: Payer: Self-pay | Admitting: Obstetrics and Gynecology

## 2011-10-24 DIAGNOSIS — Z331 Pregnant state, incidental: Secondary | ICD-10-CM

## 2011-10-24 LAB — CBC
Hemoglobin: 12.1 g/dL (ref 12.0–15.0)
MCHC: 32.9 g/dL (ref 30.0–36.0)
Platelets: 167 10*3/uL (ref 150–400)
RBC: 4.18 MIL/uL (ref 3.87–5.11)

## 2011-10-24 LAB — POCT URINALYSIS DIP (DEVICE)
Ketones, ur: NEGATIVE mg/dL
Leukocytes, UA: NEGATIVE
Nitrite: NEGATIVE
Protein, ur: 30 mg/dL — AB

## 2011-10-24 NOTE — Progress Notes (Signed)
Addended by: Levie Heritage on: 10/24/2011 09:29 AM   Modules accepted: Orders

## 2011-10-24 NOTE — Patient Instructions (Signed)
Embarazo - Segundo trimestre (Pregnancy - Second Trimester) El segundo trimestre del embarazo (del 3 al 6mes) es un perodo de evolucin rpida para usted y el beb. Hacia el final del sexto mes, el beb mide aproximadamente 23 cm y pesa 680 g. Comenzar a sentir los movimientos del beb entre las 18 y las 20 semanas de embarazo. Podr sentir las pataditas ("quickening en ingls"). Hay un rpido aumento de peso. Puede segregar un lquido claro (calostro) de las mamas. Quizs sienta pequeas contracciones en el vientre (tero) Esto se conoce como falso trabajo de parto o contracciones de Braxton-Hicks. Es como una prctica del trabajo de parto que se produce cuando el beb est listo para salir. Generalmente los problemas de vmitos matinales ya se han superado hacia el final del primer trimestre. Algunas mujeres desarrollan pequeas manchas oscuras (que se denominan cloasma, mscara del embarazo) en la cara que normalmente se van luego del nacimiento del beb. La exposicin al sol empeora las manchas. Puede desarrollarse acn en algunas mujeres embarazadas, y puede desaparecer en aquellas que ya tienen acn. EXAMENES PRENATALES  Durante los exmenes prenatales, deber seguir realizando pruebas de sangre, segn avance el embarazo. Estas pruebas se realizan para controlar su salud y la del beb. Tambin se realizan anlisis de sangre para conocer los niveles de hemoglobina. La anemia (bajo nivel de hemoglobina) es frecuente durante el embarazo. Para prevenirla, se administran hierro y vitaminas. Tambin se le realizarn exmenes para saber si tiene diabetes entre las 24 y las 28 semanas del embarazo. Podrn repetirle algunas de las pruebas que le hicieron previamente.   En cada visita le medirn el tamao del tero. Esto se realiza para asegurarse de que el beb est creciendo correctamente de acuerdo al estado del embarazo.   Tambin en cada visita prenatal controlarn su presin arterial. Esto se realiza  para asegurarse de que no tenga toxemia.   Se controlar su orina para asegurarse de que no tenga infecciones, diabetes o protena en la orina.   Se controlar su peso regularmente para asegurarse que el aumento ocurre al ritmo indicado. Esto se hace para asegurarse que usted y el beb tienen una evolucin normal.   En algunas ocasiones se realiza una prueba de ultrasonido para confirmar el correcto desarrollo y evolucin del beb. Esta prueba se realiza con ondas sonoras inofensivas para el beb, de modo que el profesional pueda calcular ms precisamente la fecha del parto.  Algunas veces se realizan pruebas especializadas del lquido amnitico que rodea al beb. Esta prueba se denomina amniocentesis. El lquido amnitico se obtiene introduciendo una aguja en el vientre (abdomen). Se realiza para controlar los cromosomas en aquellos casos en los que existe alguna preocupacin acerca de algn problema gentico que pueda sufrir el beb. En ocasiones se lleva a cabo cerca del final del embarazo, si es necesario inducir al parto. En este caso se realiza para asegurarse que los pulmones del beb estn lo suficientemente maduros como para que pueda vivir fuera del tero. CAMBIOS QUE OCURREN EN EL SEGUNDO TRIMESTRE DEL EMBARAZO Su organismo atravesar numerosos cambios durante el embarazo. Estos pueden variar de una persona a otra. Converse con el profesional que la asiste acerca los cambios que usted note y que la preocupen.  Durante el segundo trimestre probablemente sienta un aumento del apetito. Es normal tener "antojos" de ciertas comidas. Esto vara de una persona a otra y de un embarazo a otro.   El abdomen inferior comenzar a abultarse.   Podr tener la necesidad   de orinar con ms frecuencia debido a que el tero y el beb presionan sobre la vejiga. Tambin es frecuente contraer ms infecciones urinarias durante el embarazo (dolor al orinar). Puede evitarlas bebiendo gran cantidad de lquidos y  vaciando la vejiga antes y despus de mantener relaciones sexuales.   Podrn aparecer las primeras estras en las caderas, abdomen y mamas. Estos son cambios normales del cuerpo durante el embarazo. No existen medicamentos ni ejercicios que puedan prevenir estos cambios.   Es posible que comience a desarrollar venas inflamadas y abultadas (varices) en las piernas. El uso de medias de descanso, elevar sus pies durante 15 minutos, 3 a 4 veces al da y limitar la sal en su dieta ayuda a aliviar el problema.   Podr sentir acidez gstrica a medida que el tero crece y presiona contra el estmago. Puede tomar anticidos, con la autorizacin de su mdico, para aliviar este problema. Tambin es til ingerir pequeas comidas 4 a 5 veces al da.   La constipacin puede tratarse con un laxante o agregando fibra a su dieta. Beber grandes cantidades de lquidos, comer vegetales, frutas y granos integrales es de gran ayuda.   Tambin es beneficioso practicar actividad fsica. Si ha sido una persona activa hasta el embarazo, podr continuar con la mayora de las actividades durante el mismo. Si ha sido menos activa, puede ser beneficioso que comience con un programa de ejercicios, como realizar caminatas.   Puede desarrollar hemorroides (vrices en el recto) hacia el final del segundo trimestre. Tomar baos de asiento tibios y utilizar cremas recomendadas por el profesional que lo asiste sern de ayuda para los problemas de hemorroides.   Tambin podr sentir dolor de espalda durante este momento de su embarazo. Evite levantar objetos pesados, utilice zapatos de taco bajo y mantenga una buena postura para ayudar a reducir los problemas de espalda.   Algunas mujeres embarazadas desarrollan hormigueo y adormecimiento de la mano y los dedos debido a la hinchazn y compresin de los ligamentos de la mueca (sndrome del tnel carpiano). Esto desaparece una vez que el beb nace.   Como sus pechos se agrandan,  necesitar un sujetador ms grande. Use un sostn de soporte, cmodo y de algodn. No utilice un sostn para amamantar hasta el ltimo mes de embarazo si va a amamantar al beb.   Podr observar una lnea oscura desde el ombligo hacia la zona pbica denominada linea nigra.   Podr observar que sus mejillas se ponen coloradas debido al aumento de flujo sanguneo en la cara.   Podr desarrollar "araitas" en la cara, cuello y pecho. Esto desaparece una vez que el beb nace.  INSTRUCCIONES PARA EL CUIDADO DOMICILIARIO  Es extremadamente importante que evite el cigarrillo, hierbas medicinales, alcohol y las drogas no prescriptas durante el embarazo. Estas sustancias qumicas afectan la formacin y el desarrollo del beb. Evite estas sustancias durante todo el embarazo para asegurar el nacimiento de un beb sano.   La mayor parte de los cuidados que se aconsejan son los mismos que los indicados para el primer trimestre del embarazo. Cumpla con las citas tal como se le indic. Siga las instrucciones del profesional que lo asiste con respecto al uso de los medicamentos, el ejercicio y la dieta.   Durante el embarazo debe obtener nutrientes para usted y para su beb. Consuma alimentos balanceados a intervalos regulares. Elija alimentos como carne, pescado, leche y otros productos lcteos descremados, vegetales, frutas, panes integrales y cereales. El profesional le informar cul es el   aumento de peso ideal.   Las relaciones sexuales fsicas pueden continuarse hasta cerca del fin del embarazo si no existen otros problemas. Estos problemas pueden ser la prdida temprana (prematura) de lquido amnitico de las membranas, sangrado vaginal, dolor abdominal u otros problemas mdicos o del embarazo.   Realice actividad fsica todos los das, si no tiene restricciones. Consulte con el profesional que la asiste si no sabe con certeza si determinados ejercicios son seguros. El mayor aumento de peso tiene lugar  durante los ltimos 2 trimestres del embarazo. El ejercicio la ayudar a:   Controlar su peso.   Ponerla en forma para el parto.   Ayudarla a perder peso luego de haber dado a luz.   Use un buen sostn o como los que se usan para hacer deportes para aliviar la sensibilidad de las mamas. Tambin puede serle til si lo usa mientras duerme. Si pierde calostro, podr utilizar apsitos en el sostn.   No utilice la baera con agua caliente, baos turcos y saunas durante el embarazo.   Utilice el cinturn de seguridad sin excepcin cuando conduzca. Este la proteger a usted y al beb en caso de accidente.   Evite comer carne cruda, queso crudo, y el contacto con los utensilios y desperdicios de los gatos. Estos elementos contienen grmenes que pueden causar defectos de nacimiento en el beb.   El segundo trimestre es un buen momento para visitar a su dentista y evaluar su salud dental si an no lo ha hecho. Es importante mantener los dientes limpios. Utilice un cepillo de dientes blando. Cepllese ms suavemente durante el embarazo.   Es ms fcil perder algo de orina durante el embarazo. Apretar y fortalecer los msculos de la pelvis la ayudar con este problema. Practique detener la miccin cuando est en el bao. Estos son los mismos msculos que necesita fortalecer. Son tambin los mismos msculos que utiliza cuando trata de evitar los gases. Puede practicar apretando estos msculos 10 veces, y repetir esto 3 veces por da aproximadamente. Una vez que conozca qu msculos debe apretar, no realice estos ejercicios durante la miccin. Puede favorecerle una infeccin si la orina vuelve hacia atrs.   Pida ayuda si tiene necesidades econmicas, de asesoramiento o nutricionales durante el embarazo. El profesional podr ayudarla con respecto a estas necesidades, o derivarla a otros especialistas.   La piel puede ponerse grasa. Si esto sucede, lvese la cara con un jabn suave, utilice un humectante no  graso y maquillaje con base de aceite o crema.  CONSUMO DE MEDICAMENTOS Y DROGAS DURANTE EL EMBARAZO  Contine tomando las vitaminas apropiadas para esta etapa tal como se le indic. Las vitaminas deben contener un miligramo de cido flico y deben suplementarse con hierro. Guarde todas las vitaminas fuera del alcance de los nios. La ingestin de slo un par de vitaminas o tabletas que contengan hierro puede ocasionar la muerte en un beb o en un nio pequeo.   Evite el uso de medicamentos, inclusive los de venta libre y hierbas que no hayan sido prescriptos o indicados por el profesional que la asiste. Algunos medicamentos pueden causar problemas fsicos al beb. Utilice los medicamentos de venta libre o de prescripcin para el dolor, el malestar o la fiebre, segn se lo indique el profesional que lo asiste. No utilice aspirina.   El consumo de alcohol est relacionado con ciertos defectos de nacimiento. Esto incluye el sndrome de alcoholismo fetal. Debe evitar el consumo de alcohol en cualquiera de sus formas. El cigarrillo   causa nacimientos prematuros y bebs de bajo peso. El uso de drogas recreativas est absolutamente prohibido. Son muy nocivas para el beb. Un beb que nace de una madre adicta, ser adicto al nacer. Ese beb tendr los mismos sntomas de abstinencia que un adulto.   Infrmele al profesional si consume alguna droga.   No consuma drogas ilegales. Pueden causarle mucho dao al beb.  SOLICITE ATENCIN MDICA SI: Tiene preguntas o preocupaciones durante su embarazo. Es mejor que llame para consultar las dudas que esperar hasta su prxima visita prenatal. De esta forma se sentir ms tranquila.  SOLICITE ATENCIN MDICA DE INMEDIATO SI:  La temperatura oral se eleva sin motivo por encima de 102 F (38.9 C) o segn le indique el profesional que lo asiste.   Tiene una prdida de lquido por la vagina (canal de parto). Si sospecha una ruptura de las membranas, tmese la  temperatura y llame al profesional para informarlo sobre esto.   Observa unas pequeas manchas, una hemorragia vaginal o elimina cogulos. Notifique al profesional acerca de la cantidad y de cuntos apsitos est utilizando. Unas pequeas manchas de sangre son algo comn durante el embarazo, especialmente despus de mantener relaciones sexuales.   Presenta un olor desagradable en la secrecin vaginal y observa un cambio en el color, de transparente a blanco.   Contina con las nuseas y no obtiene alivio de los remedios indicados. Vomita sangre o algo similar a la borra del caf.   [place] o sube ms de 900 g. en una semana, o segn lo indicado por el profesional que la asiste.   Observa que se le hinchan el rostro, las manos, los pies o las piernas.   Ha estado expuesta a la rubola y no ha sufrido la enfermedad.   Ha estado expuesta a la quinta enfermedad o a la varicela.   Presenta dolor abdominal. Las molestias en el ligamento redondo son una causa no cancerosa (benigna) frecuente de dolor abdominal durante el embarazo. El profesional que la asiste deber evaluarla.   Presenta dolor de cabeza intenso que no se alivia.   Presenta fiebre, diarrea, dolor al orinar o le falta la respiracin.   Presenta dificultad para ver, visin borrosa, o visin doble.   Sufre una cada, un accidente de trnsito o cualquier tipo de trauma.   Vive en un hogar en el que existe violencia fsica o mental.  Document Released: 09/05/2005 Document Revised: 08/08/2011 [employer] Patient Information 2012 [employer]. 

## 2011-10-24 NOTE — Progress Notes (Signed)
Patient without complaints.  Denies vaginal bleeding, abnormal vaginal discharge, contractions, loss of fluid.  Reports good fetal activity.  1hr GTC done today.  Follow up in 2 weeks.

## 2011-10-25 LAB — GLUCOSE TOLERANCE, 1 HOUR: Glucose, 1 Hour GTT: 167 mg/dL — ABNORMAL HIGH (ref 70–140)

## 2011-11-07 ENCOUNTER — Ambulatory Visit (INDEPENDENT_AMBULATORY_CARE_PROVIDER_SITE_OTHER): Payer: Self-pay | Admitting: Family Medicine

## 2011-11-07 VITALS — BP 115/71 | HR 101 | Temp 98.3°F | Wt 170.0 lb

## 2011-11-07 DIAGNOSIS — Z8632 Personal history of gestational diabetes: Secondary | ICD-10-CM

## 2011-11-07 DIAGNOSIS — Z331 Pregnant state, incidental: Secondary | ICD-10-CM

## 2011-11-07 DIAGNOSIS — R7309 Other abnormal glucose: Secondary | ICD-10-CM

## 2011-11-07 LAB — POCT URINALYSIS DIP (DEVICE)
Glucose, UA: NEGATIVE mg/dL
Hgb urine dipstick: NEGATIVE
Nitrite: NEGATIVE
Urobilinogen, UA: 2 mg/dL — ABNORMAL HIGH (ref 0.0–1.0)

## 2011-11-07 NOTE — Progress Notes (Signed)
Ob US growth scheduled on 11/08/11 @ 1445

## 2011-11-07 NOTE — Patient Instructions (Signed)
Diabetes mellitus gestacional (Gestational Diabetes Mellitus) La diabetes mellitus gestacional se produce slo durante el embarazo. Aparece cuando el organismo no puede controlar adecuadamente la glucosa (azcar) que aumenta en la sangre despus de comer. Durante el embarazo, se produce una resistencia a la insulina (sensibilidad reducida a la insulina) debido a la liberacin de hormonas por parte de la placenta. Generalmente, el pncreas de una mujer embarazada produce la cantidad suficiente de insulina para vencer esa resistencia. Sin embargo, en la diabetes gestacional, hay insulina pero no cumple su funcin adecuadamente. Si la resistencia es lo suficientemente grave como para que el pncreas no produzca la cantidad de insulina suficiente, la glucosa extra se acumula en la sangre.  QUINES TIENEN RIESGO DE DESARROLLAR DIABETES GESTACIONAL?  Las mujeres con historia de diabetes en la familia.   Las mujeres de ms de 25 aos.   Las que presentan sobrepeso.   Las mujeres que pertenecen a ciertos grupos tnicos (latinas, afroamericanas, norteamericanas nativas, asiticas y las originarias de las islas del Pacfico.  QUE PUEDE OCURRIRLE AL BEB? Si el nivel de glucosa en sangre de la madre es demasiado elevado mientras este embarazada, el nivel extra de azcar pasar por el cordn umbilical hacia el beb. Algunos de los problemas del beb pueden ser:  Beb demasiado grande: si el nio recibe demasiada azcar, puede aumentar mucho de peso. Esto puede hacer que sea demasiado grande para nacer por parto normal (vaginal) por lo que ser necesario realizar una cesrea.   Bajo nivel de glucosa (hipoglucemia): el beb produce insulina extra en respuesta a la excesiva cantidad de azcar que obtiene de la madre. Cuando el beb nace y ya no necesita insulina extra, su nivel de azcar en sangre puede disminuir.   Ictericia (coloracin amarillenta de la piel y los ojos): esto es bastante frecuente en los  bebs. La causa es la acumulacin de una sustancia qumica denominada bilirrubina. No siempre es un trastorno grave, pero se observa con frecuencia en los bebs cuyas madres sufren diabetes gestacional.  RIESGOS PARA LA MADRE Las mujeres que han sufrido diabetes gestacional pueden tener ms riesgos para algunos problemas como:  Preeclampsia o toxemia, incluyendo problemas con hipertensin arterial. La presin arterial y los niveles de protenas en la orina deben controlarse con frecuencia.   Infecciones   Parto por cesrea.   Aparicin de diabetes tipo 2 en una etapa posterior de la vida. Alrededor del 30% al 50% sufrir diabetes posteriormente, especialmente las que son obesas.  DIAGNSTICO Las hormonas que causan resistencia a la insulina tienen su mayor nivel alrededor de las 24 a 28 semanas del embarazo. Si se experimentan sntomas, stos son similares a los sntomas que normalmente aparecen durante el embarazo.  La diabetes mellitus gestacional generalmente se diagnostica por medio de un mtodo en dos partes: 1. Despus de la 24 a 28 semanas de embarazo, la mujer debe beber una solucin que contiene glucosa y realizar un anlisis de sangre. Si el nivel de glucosa es elevado, la realizarn un segundo anlisis.  2. La prueba oral de tolerancia a la glucosa, que dura aproximadamente tres horas. Despus de realizar ayuno durante la noche, se controla nivel de glucosa en sangre. La mujer bebe una solucin que contiene glucosa y le realizan anlisis de glucosa en sangre cada hora.  Si la mujer tiene factores de riesgos para la diabetes mellitus gestacional, el mdico podr indicar el anlisis antes de las 24 semanas de embarazo. TRATAMIENTO El tratamiento est dirigido a mantener la glucosa en   sangre de la madre en un nivel normal y puede incluir:  La planificacin de los alimentos.   Recibir insulina u otro medicamento para controlar el nivel de glucosa en sangre.   La prctica de ejercicios.    Llevar un registro diario de los alimentos que consume.   Control y registro de los niveles de glucosa en sangre.   Control de los niveles de cetona en la orina, aunque esto ya no se considera necesario en la mayora de los embarazos.  INSTRUCCIONES PARA EL CUIDADO DOMICILIARIO Mientras est embarazada:  Siga los consejos de su mdico relacionados con los controles prenatales, la planificacin de la comida, la actividad fsica, los medicamentos, vitaminas, los anlisis de sangre y otras pruebas y las actividades fsicas.   Lleve un registro de las comidas, las pruebas de glucosa en sangre y la cantidad de insulina que recibe (si corresponde). Muestre todo al profesional en cada consulta mdica prenatal.   Si sufre diabetes mellitus gestacional, podr tener problemas de hipoglucemia (nivel bajo de glucosa en sangre). Podr sospechar este problema si se siente repentinamente mareada, tiene temblores y/o se siente dbil. Si cree que esto le est ocurriendo, y tiene un medidor de glucosa, mida su nivel de glucosa en sangre. Siga los consejos de su mdico sobre el modo y el momento de tratar su nivel de glucosa en sangre. Generalmente se sigue la regla 15:15 Consuma 15 g de hidratos de carbono, espere 15 minutos y vuelva controlar el nivel de glucosa en sangre.. Ejemplos de 15 g de hidratos de carbono son:   1 taza de leche descremada.    taza de jugo.   3-4 tabletas de glucosa.   5-6 caramelos duros.   1 caja pequea de pasas de uva.    taza de gaseosa comn.   Mantenga una buena higiene para evitar infecciones.   No fume.  SOLICITE ATENCIN MDICA SI:  Observa prdida vaginal con o sin picazn.   Se siente ms dbil o cansada que lo habitual.   Transpira mucho.   Tiene un aumento de peso repentino, 2,5 kg o ms en una semana.   Pierde peso, 1.5 kg o ms en una semana.   Su nivel de glucosa en sangre es elevado, necesita instrucciones.  SOLICITE ATENCIN MDICA DE  INMEDIATO SI:  Sufre una cefalea intensa.   Se marea o pierde el conocimiento   Presenta nuseas o vmitos.   Se siente desorientada confundida.   Sufre convulsiones.   Tiene problemas de visin.   Siente dolor en el estmago.   Presenta una hemorragia vaginal abundante.   Tiene contracciones uterinas.   Tiene una prdida importante de lquido por la vagina  DESPUS QUE NACE EL BEB:  Concurra a todos los controles de seguimiento y realice los anlisis de sangre segn las indicaciones de su mdico.   Mantenga un estilo de vida saludable para evitar la diabetes en el futuro. Aqu se incluye:   Siga el plan de alimentacin saludable.   Controle su peso.   Practique actividad fsica y descanse lo necesario.   No fume.   Amamante a su beb mientras pueda. Esto disminuir la probabilidad de que usted y su beb sufran diabetes posteriormente.  Para ms informacin acerca de la diabetes, visite la pgina web de la American Diabetes Association: www.americandiabetesassociation.org. Para ms informacin acerca de la diabetes gestacional cite la pgina web del American Congress of Obstetricians and Gynecologists en: www.acog.org. Document Released: 09/05/2005 Document Revised: 08/08/2011 ExitCare Patient Information 2012   [employer]. 

## 2011-11-07 NOTE — Progress Notes (Signed)
Patient had fever yesterday, nasal congestion.  Denies SOB, cough, wheezing, sore throat.  Feels somewhat improved today.  Ears - non-infected.  Denies vaginal bleeding, abnormal vaginal discharge, contractions, loss of fluid.  Reports good fetal activity.  Has elevated 1hr GTT.  3hr GTT scheduled.  Also scheduled follow up US for 32 weeks.  Follow up with provider in 2 weeks.

## 2011-11-07 NOTE — Progress Notes (Signed)
Pain: pelvic Pressure: pelvic 

## 2011-11-08 ENCOUNTER — Ambulatory Visit (HOSPITAL_COMMUNITY)
Admission: RE | Admit: 2011-11-08 | Discharge: 2011-11-08 | Disposition: A | Discharge status: Home / Self Care | Payer: Self-pay | Source: Ambulatory Visit | Attending: Family Medicine | Admitting: Family Medicine

## 2011-11-08 DIAGNOSIS — Z3689 Encounter for other specified antenatal screening: Secondary | ICD-10-CM | POA: Insufficient documentation

## 2011-11-08 DIAGNOSIS — O99810 Abnormal glucose complicating pregnancy: Secondary | ICD-10-CM | POA: Insufficient documentation

## 2011-11-15 ENCOUNTER — Other Ambulatory Visit: Payer: Self-pay

## 2011-11-15 DIAGNOSIS — R7309 Other abnormal glucose: Secondary | ICD-10-CM

## 2011-11-16 LAB — GLUCOSE TOLERANCE, 3 HOURS
Glucose Tolerance, 1 hour: 189 mg/dL (ref 70–189)
Glucose Tolerance, 2 hour: 175 mg/dL — ABNORMAL HIGH (ref 70–164)
Glucose Tolerance, Fasting: 85 mg/dL (ref 70–104)

## 2011-11-19 ENCOUNTER — Encounter: Payer: Self-pay | Attending: Obstetrics and Gynecology | Admitting: Dietician

## 2011-11-19 ENCOUNTER — Ambulatory Visit (INDEPENDENT_AMBULATORY_CARE_PROVIDER_SITE_OTHER): Payer: Self-pay | Admitting: Obstetrics & Gynecology

## 2011-11-19 DIAGNOSIS — Z8632 Personal history of gestational diabetes: Secondary | ICD-10-CM

## 2011-11-19 DIAGNOSIS — Z713 Dietary counseling and surveillance: Secondary | ICD-10-CM | POA: Insufficient documentation

## 2011-11-19 DIAGNOSIS — O0990 Supervision of high risk pregnancy, unspecified, unspecified trimester: Secondary | ICD-10-CM

## 2011-11-19 DIAGNOSIS — O99810 Abnormal glucose complicating pregnancy: Secondary | ICD-10-CM | POA: Insufficient documentation

## 2011-11-19 LAB — POCT URINALYSIS DIP (DEVICE)
Hgb urine dipstick: NEGATIVE
Leukocytes, UA: NEGATIVE
Nitrite: NEGATIVE
Protein, ur: 30 mg/dL — AB
Urobilinogen, UA: 1 mg/dL (ref 0.0–1.0)
pH: 7 (ref 5.0–8.0)

## 2011-11-19 NOTE — Progress Notes (Signed)
Pelvic pressure. Pulse 89.

## 2011-11-19 NOTE — Patient Instructions (Signed)
Diabetes mellitus gestacional La diabetes mellitus gestacional se produce slo durante el embarazo. Aparece cuando el organismo no puede controlar adecuadamente la glucosa (azcar) que aumenta en la sangre despus de comer. Durante el [place], se produce una resistencia a la insulina (sensibilidad reducida a la insulina) debido a la liberacin de hormonas por parte de la placenta. Generalmente, el pncreas de una mujer embarazada produce la cantidad suficiente de insulina para vencer esa resistencia. Sin embargo, en la diabetes gestacional, hay insulina pero no cumple su funcin adecuadamente. Si la resistencia es lo suficientemente grave como para que el pncreas no produzca la cantidad de insulina suficiente, la glucosa extra se acumula en la sangre.  Devota Pace RIESGO DE DESARROLLAR DIABETES GESTACIONAL?  Las mujeres con historia de diabetes en la familia.   Las mujeres de ms de [address]   Las que presentan sobrepeso.   Las [employer] pertenecen a ciertos grupos tnicos (latinas, afroamericanas, norteamericanas nativas, asiticas y las originarias de las islas del Pacfico.  QUE PUEDE OCURRIRLE AL BEB? Si el nivel de glucosa en sangre de la madre es demasiado elevado mientras este [place], el nivel extra de azcar pasar por el cordn umbilical hacia el beb. Algunos de los problemas del beb pueden ser:  Beb demasiado grande: si el nio recibe Chief Strategy Officer, puede aumentar mucho de [place]. Esto puede hacer que sea demasiado grande para nacer por parto normal (vaginal) por lo que ser necesario realizar una cesrea.   Bajo nivel de glucosa (hipoglucemia): el beb produce insulina extra en respuesta a la excesiva cantidad de azcar que obtiene de [employer]. Cuando el beb nace y ya no necesita insulina extra, su nivel de azcar en sangre puede disminuir.   Ictericia (coloracin amarillenta de la piel y los ojos): esto es bastante frecuente en los bebs. La causa es la acumulacin de  una sustancia qumica denominada bilirrubina. No siempre es un trastorno grave, pero se observa con frecuencia en los bebs cuyas madres sufren diabetes gestacional.  RIESGOS PARA LA MADRE Las mujeres que han sufrido diabetes gestacional pueden tener ms riesgos para algunos problemas como:  Preeclampsia o toxemia, incluyendo problemas con hipertensin arterial. La presin arterial y los niveles de protenas en la orina deben controlarse con frecuencia.   Infecciones   Parto por cesrea.   Aparicin de diabetes tipo 2 en una etapa posterior de la vida. Alrededor del 30% al 50% sufrir diabetes posteriormente, especialmente las que son obesas.  DIAGNSTICO Las hormonas que causan resistencia a la insulina tienen su mayor nivel alrededor de las 24 a 28 semanas del Psychiatrist. Si se experimentan sntomas, stos son similares a los sntomas que normalmente aparecen durante el embarazo.  La diabetes mellitus gestacional generalmente se diagnostica por medio de un mtodo en dos partes: 1. Despus de la 24 a 28 semanas de Psychiatrist, la mujer debe beber una solucin que contiene glucosa y Education officer, environmental un anlisis de [place]. Si el nivel de glucosa es elevado, la realizarn un segundo [place].  2. La prueba oral de tolerancia a la glucosa, que dura aproximadamente tres horas. Despus de realizar ayuno durante la noche, se controla nivel de glucosa en sangre. La mujer bebe una solucin que contiene glucosa y Chief Executive Officer realizan anlisis de glucosa en sangre cada hora.  Si la mujer tiene factores de riesgos para la diabetes mellitus gestacional, el mdico podr Programme researcher, broadcasting/film/video anlisis antes de las 24 semanas de [place]. TRATAMIENTO El tratamiento est dirigido a Insurance underwriter en sangre de la  madre en un nivel normal y puede incluir:  La planificacin de los alimentos.   Recibir insulina u otro medicamento para Sales executive nivel de glucosa en [place].   La prctica de ejercicios.   Llevar un registro diario de los  alimentos que consume.   Control y Engineer, maintenance (IT) de los niveles de glucosa en [place].   Control de los niveles de cetona en la [place], [place] esto ya no se considera necesario en la mayora de los [place].  INSTRUCCIONES PARA EL CUIDADO DOMICILIARIO Mientras est embarazada:  Siga los consejos de su mdico relacionados con los controles prenatales, la planificacin de la comida, la actividad fsica, los [place], vitaminas, los anlisis de sangre y otras pruebas y las actividades fsicas.   Lleve un registro de las comidas, las pruebas de glucosa en sangre y la cantidad de insulina que recibe (si corresponde). Muestre todo al profesional en cada consulta mdica prenatal.   Si sufre diabetes mellitus gestacional, podr tener problemas de hipoglucemia (nivel bajo de glucosa en sangre). Podr sospechar este problema si se siente repentinamente mareada, tiene temblores y/o se siente dbil. Si cree que esto le est ocurriendo, y tiene un medidor de glucosa, mida su nivel de Event organiser. Siga los consejos de su mdico sobre el modo y el momento de tratar su nivel de glucosa en sangre. Generalmente se sigue la regla 15:15 Consuma 15 g de hidratos de carbono, espere 15 minutos y Programmer, systems el nivel de glucosa en [place].Barbara Cower de 15 g de hidratos de carbono son:   1 taza de [employer].    taza de jugo.   3-4 tabletas de glucosa.   5-6 caramelos duros.   1 caja pequea de pasas de uva.    taza de gaseosa comn.   Mantenga una buena higiene para evitar infecciones.   No fume.  SOLICITE ATENCIN MDICA SI:  Observa prdida vaginal con o sin picazn.   Se siente ms dbil o cansada que lo habitual.   [place].   Tiene un aumento de peso repentino, 2,5 kg o ms en una semana.   Pierde peso, 1.5 kg o ms en una semana.   Su nivel de glucosa en sangre es elevado, necesita instrucciones.  SOLICITE ATENCIN MDICA DE INMEDIATO SI:  Sufre una cefalea intensa.     Se marea o pierde el conocimiento   Presenta nuseas o vmitos.   Se siente desorientada confundida.   Sufre convulsiones.   Tiene problemas de visin.   Siente Physiological scientist.   Presenta una hemorragia vaginal abundante.   Tiene contracciones uterinas.   Tiene una prdida importante de lquido por la vagina  DESPUS QUE NACE EL BEB:  Concurra a todos los controles de seguimiento y Clinical biochemist los anlisis de sangre segn las indicaciones de su mdico.   Mantenga un estilo de vida saludable para evitar la diabetes en el futuro. Aqu se incluye:   Siga el plan de alimentacin saludable.   Controle su peso.   Practique actividad fsica y descanse lo necesario.   No fume.   Amamante a su beb mientras pueda. Esto disminuir la probabilidad de que usted y su beb sufran diabetes posteriormente.  Para ms informacin acerca de la diabetes, visite la pgina web de Holiday representative Diabetes Association: PMFashions.com.cy. Para ms informacin acerca de la diabetes gestacional cite la pgina web del [employer]ians [employer] Gynecologists en: RentRule.com.au. Document Released: 09/05/2005 Document Revised: 08/08/2011 [facility] Patient Information 2012 [place], [place].

## 2011-11-19 NOTE — Progress Notes (Signed)
11/19/2011 Diabetes Education:  Completed review of the GDM diet with the assistance of Raynelle Fanning the [place] interpreter. Provide in Spanish Handout Nutrition, Diabetes and Pregnancy, Carbohydrate Counting by [employer], and handout of glucose levels and testing times.  Provided True Track Meter Lot [id number] Exp: 2013/04/08. Strips Lot [id number] Epx: 2013/04/10, Lancets Lot [id number], Exp: 2016/02/17  She completed a return demonstration of blood glucose testing and her post breakfast glucose was 104 mg.  Informed to bring meter and glucose log book to each clinic appointment.  Maggie Evalynn Hankins, RN, RD, CDE.

## 2011-11-19 NOTE — Progress Notes (Signed)
New diagnosis of GDM, will meet with DM educator today.  Denies contractions, vaginal bleeding or leaking of fluid.  Reports good fetal movement and pelvic pressure during fetal movement. No other complaints or concerns.  Fetal movement and labor precautions reviewed. Plans to breastpump and bottle feed; Implanon for BCM.  Return to clinic in 1 week to review blood sugars and further management.

## 2011-11-26 ENCOUNTER — Ambulatory Visit (INDEPENDENT_AMBULATORY_CARE_PROVIDER_SITE_OTHER): Payer: Self-pay | Admitting: Obstetrics and Gynecology

## 2011-11-26 ENCOUNTER — Encounter: Payer: Self-pay | Admitting: Dietician

## 2011-11-26 DIAGNOSIS — O99810 Abnormal glucose complicating pregnancy: Secondary | ICD-10-CM

## 2011-11-26 DIAGNOSIS — N760 Acute vaginitis: Secondary | ICD-10-CM

## 2011-11-26 DIAGNOSIS — O24419 Gestational diabetes mellitus in pregnancy, unspecified control: Secondary | ICD-10-CM

## 2011-11-26 DIAGNOSIS — O0990 Supervision of high risk pregnancy, unspecified, unspecified trimester: Secondary | ICD-10-CM

## 2011-11-26 LAB — WET PREP, GENITAL
Trich, Wet Prep: NONE SEEN
Yeast Wet Prep HPF POC: NONE SEEN

## 2011-11-26 LAB — POCT URINALYSIS DIP (DEVICE)
Leukocytes, UA: NEGATIVE
Nitrite: NEGATIVE
Protein, ur: 30 mg/dL — AB
Urobilinogen, UA: 1 mg/dL (ref 0.0–1.0)
pH: 7 (ref 5.0–8.0)

## 2011-11-26 NOTE — Progress Notes (Signed)
U/S 12/06/11 at 245 pm.

## 2011-11-26 NOTE — Progress Notes (Signed)
Yellow discharge with itching.

## 2011-11-26 NOTE — Progress Notes (Signed)
CBG fasting 88, 101, 113; 2hr pB 108, 106, 74; pL 91; pD 91. Patient has not been checking CBG's (only checked 3 days fasting and post breakfast. Randomly checked checked post lunch and dinner once). Patient stated that she knew she wasn't adhering to the diet and expected her cbg's to be high. Importance of checking sugars 4 times daily reviewed. Patient advised to exercise regularly 30 min/day. Patient also c/o vaginal pruritis with discharge. Wet prep collected

## 2011-11-26 NOTE — Progress Notes (Signed)
Addended by: Catalina Antigua on: 11/26/2011 11:29 AM   Modules accepted: Orders

## 2011-11-27 MED ORDER — METRONIDAZOLE 500 MG PO TABS: 500.0000 mg | ORAL_TABLET | Freq: Two times a day (BID) | ORAL | Status: DC

## 2011-11-27 NOTE — Progress Notes (Signed)
Addended by: Catalina Antigua on: 11/27/2011 01:45 PM   Modules accepted: Orders

## 2011-11-28 ENCOUNTER — Telehealth: Payer: Self-pay | Admitting: *Deleted

## 2011-11-28 DIAGNOSIS — O0990 Supervision of high risk pregnancy, unspecified, unspecified trimester: Secondary | ICD-10-CM

## 2011-11-28 DIAGNOSIS — B9689 Other specified bacterial agents as the cause of diseases classified elsewhere: Secondary | ICD-10-CM

## 2011-11-28 DIAGNOSIS — O24419 Gestational diabetes mellitus in pregnancy, unspecified control: Secondary | ICD-10-CM

## 2011-11-28 MED ORDER — METRONIDAZOLE 500 MG PO TABS: 500.0000 mg | ORAL_TABLET | Freq: Two times a day (BID) | ORAL | Status: AC

## 2011-11-28 NOTE — Telephone Encounter (Signed)
Message copied by Mannie Stabile on Wed Nov 28, 2011  1:21 PM ------      Message from: Catalina Antigua      Created: Tue Nov 27, 2011  1:45 PM       Please inform patient of diagnosis of BV and that a prescription for flagyl has been e-prescribed            Gigi Gin

## 2011-11-28 NOTE — Telephone Encounter (Signed)
Called pt using [employer] # [id number]. Informed her of the diagnosis of bv. She requested that we send her prescription to the [facility] on [address] instead of [address]. I advised her that we can do this and she can pick up the medication later.

## 2011-12-06 ENCOUNTER — Ambulatory Visit (INDEPENDENT_AMBULATORY_CARE_PROVIDER_SITE_OTHER): Payer: Self-pay | Admitting: Family Medicine

## 2011-12-06 ENCOUNTER — Ambulatory Visit (HOSPITAL_COMMUNITY)
Admission: RE | Admit: 2011-12-06 | Discharge: 2011-12-06 | Disposition: A | Discharge status: Home / Self Care | Payer: Self-pay | Source: Ambulatory Visit | Attending: Obstetrics and Gynecology | Admitting: Obstetrics and Gynecology

## 2011-12-06 VITALS — BP 122/77 | Temp 97.3°F | Wt 170.5 lb

## 2011-12-06 DIAGNOSIS — O99810 Abnormal glucose complicating pregnancy: Secondary | ICD-10-CM | POA: Insufficient documentation

## 2011-12-06 DIAGNOSIS — O24419 Gestational diabetes mellitus in pregnancy, unspecified control: Secondary | ICD-10-CM

## 2011-12-06 DIAGNOSIS — Z3689 Encounter for other specified antenatal screening: Secondary | ICD-10-CM | POA: Insufficient documentation

## 2011-12-06 LAB — POCT URINALYSIS DIP (DEVICE)
Ketones, ur: 15 mg/dL — AB
Leukocytes, UA: NEGATIVE
Protein, ur: 100 mg/dL — AB
Urobilinogen, UA: 1 mg/dL (ref 0.0–1.0)
pH: 7 (ref 5.0–8.0)

## 2011-12-06 MED ORDER — GLYBURIDE 2.5 MG PO TABS: 2.5000 mg | ORAL_TABLET | Freq: Two times a day (BID) | ORAL | Status: DC

## 2011-12-06 NOTE — Progress Notes (Signed)
Pulse 9. No vaginal discharge.

## 2011-12-06 NOTE — Progress Notes (Signed)
Fasting 85-114 with most > 90 and 2hr pp 90-182 with most > 120.    Will start glyburide 2.5mg  bid. 2+ proteinuria.  Will continue to monitor.  Pressures normal. No contractions, vaginal bleeding, discharge.

## 2011-12-06 NOTE — Patient Instructions (Signed)
Diabetes mellitus gestacional (Gestational Diabetes Mellitus) La diabetes mellitus gestacional se produce slo durante el embarazo. Aparece cuando el organismo no puede controlar adecuadamente la glucosa (azcar) que aumenta en la sangre despus de comer. Durante el embarazo, se produce una resistencia a la insulina (sensibilidad reducida a la insulina) debido a la liberacin de hormonas por parte de la placenta. Generalmente, el pncreas de una mujer embarazada produce la cantidad suficiente de insulina para vencer esa resistencia. Sin embargo, en la diabetes gestacional, hay insulina pero no cumple su funcin adecuadamente. Si la resistencia es lo suficientemente grave como para que el pncreas no produzca la cantidad de insulina suficiente, la glucosa extra se acumula en la sangre.  QUINES TIENEN RIESGO DE DESARROLLAR DIABETES GESTACIONAL?  Las mujeres con historia de diabetes en la familia.   Las mujeres de ms de 25 aos.   Las que presentan sobrepeso.   Las mujeres que pertenecen a ciertos grupos tnicos (latinas, afroamericanas, norteamericanas nativas, asiticas y las originarias de las islas del Pacfico.  QUE PUEDE OCURRIRLE AL BEB? Si el nivel de glucosa en sangre de la madre es demasiado elevado mientras este embarazada, el nivel extra de azcar pasar por el cordn umbilical hacia el beb. Algunos de los problemas del beb pueden ser:  Beb demasiado grande: si el nio recibe demasiada azcar, puede aumentar mucho de peso. Esto puede hacer que sea demasiado grande para nacer por parto normal (vaginal) por lo que ser necesario realizar una cesrea.   Bajo nivel de glucosa (hipoglucemia): el beb produce insulina extra en respuesta a la excesiva cantidad de azcar que obtiene de la madre. Cuando el beb nace y ya no necesita insulina extra, su nivel de azcar en sangre puede disminuir.   Ictericia (coloracin amarillenta de la piel y los ojos): esto es bastante frecuente en los  bebs. La causa es la acumulacin de una sustancia qumica denominada bilirrubina. No siempre es un trastorno grave, pero se observa con frecuencia en los bebs cuyas madres sufren diabetes gestacional.  RIESGOS PARA LA MADRE Las mujeres que han sufrido diabetes gestacional pueden tener ms riesgos para algunos problemas como:  Preeclampsia o toxemia, incluyendo problemas con hipertensin arterial. La presin arterial y los niveles de protenas en la orina deben controlarse con frecuencia.   Infecciones   Parto por cesrea.   Aparicin de diabetes tipo 2 en una etapa posterior de la vida. Alrededor del 30% al 50% sufrir diabetes posteriormente, especialmente las que son obesas.  DIAGNSTICO Las hormonas que causan resistencia a la insulina tienen su mayor nivel alrededor de las 24 a 28 semanas del embarazo. Si se experimentan sntomas, stos son similares a los sntomas que normalmente aparecen durante el embarazo.  La diabetes mellitus gestacional generalmente se diagnostica por medio de un mtodo en dos partes: 1. Despus de la 24 a 28 semanas de embarazo, la mujer debe beber una solucin que contiene glucosa y realizar un anlisis de sangre. Si el nivel de glucosa es elevado, la realizarn un segundo anlisis.  2. La prueba oral de tolerancia a la glucosa, que dura aproximadamente tres horas. Despus de realizar ayuno durante la noche, se controla nivel de glucosa en sangre. La mujer bebe una solucin que contiene glucosa y le realizan anlisis de glucosa en sangre cada hora.  Si la mujer tiene factores de riesgos para la diabetes mellitus gestacional, el mdico podr indicar el anlisis antes de las 24 semanas de embarazo. TRATAMIENTO El tratamiento est dirigido a mantener la glucosa en   sangre de la madre en un nivel normal y puede incluir:  La planificacin de los alimentos.   Recibir insulina u otro medicamento para controlar el nivel de glucosa en sangre.   La prctica de ejercicios.    Llevar un registro diario de los alimentos que consume.   Control y registro de los niveles de glucosa en sangre.   Control de los niveles de cetona en la orina, aunque esto ya no se considera necesario en la mayora de los embarazos.  INSTRUCCIONES PARA EL CUIDADO DOMICILIARIO Mientras est embarazada:  Siga los consejos de su mdico relacionados con los controles prenatales, la planificacin de la comida, la actividad fsica, los medicamentos, vitaminas, los anlisis de sangre y otras pruebas y las actividades fsicas.   Lleve un registro de las comidas, las pruebas de glucosa en sangre y la cantidad de insulina que recibe (si corresponde). Muestre todo al profesional en cada consulta mdica prenatal.   Si sufre diabetes mellitus gestacional, podr tener problemas de hipoglucemia (nivel bajo de glucosa en sangre). Podr sospechar este problema si se siente repentinamente mareada, tiene temblores y/o se siente dbil. Si cree que esto le est ocurriendo, y tiene un medidor de glucosa, mida su nivel de glucosa en sangre. Siga los consejos de su mdico sobre el modo y el momento de tratar su nivel de glucosa en sangre. Generalmente se sigue la regla 15:15 Consuma 15 g de hidratos de carbono, espere 15 minutos y vuelva controlar el nivel de glucosa en sangre.. Ejemplos de 15 g de hidratos de carbono son:   1 taza de leche descremada.    taza de jugo.   3-4 tabletas de glucosa.   5-6 caramelos duros.   1 caja pequea de pasas de uva.    taza de gaseosa comn.   Mantenga una buena higiene para evitar infecciones.   No fume.  SOLICITE ATENCIN MDICA SI:  Observa prdida vaginal con o sin picazn.   Se siente ms dbil o cansada que lo habitual.   Transpira mucho.   Tiene un aumento de peso repentino, 2,5 kg o ms en una semana.   Pierde peso, 1.5 kg o ms en una semana.   Su nivel de glucosa en sangre es elevado, necesita instrucciones.  SOLICITE ATENCIN MDICA DE  INMEDIATO SI:  Sufre una cefalea intensa.   Se marea o pierde el conocimiento   Presenta nuseas o vmitos.   Se siente desorientada confundida.   Sufre convulsiones.   Tiene problemas de visin.   Siente dolor en el estmago.   Presenta una hemorragia vaginal abundante.   Tiene contracciones uterinas.   Tiene una prdida importante de lquido por la vagina  DESPUS QUE NACE EL BEB:  Concurra a todos los controles de seguimiento y realice los anlisis de sangre segn las indicaciones de su mdico.   Mantenga un estilo de vida saludable para evitar la diabetes en el futuro. Aqu se incluye:   Siga el plan de alimentacin saludable.   Controle su peso.   Practique actividad fsica y descanse lo necesario.   No fume.   Amamante a su beb mientras pueda. Esto disminuir la probabilidad de que usted y su beb sufran diabetes posteriormente.  Para ms informacin acerca de la diabetes, visite la pgina web de la American Diabetes Association: www.americandiabetesassociation.org. Para ms informacin acerca de la diabetes gestacional cite la pgina web del American Congress of Obstetricians and Gynecologists en: www.acog.org. Document Released: 09/05/2005 Document Revised: 08/08/2011 ExitCare Patient Information 2012   [employer]. 

## 2011-12-11 NOTE — L&D Delivery Note (Signed)
Delivery Note At 8:59 PM a viable female was delivered via Vaginal, Spontaneous Delivery (Presentation: ; Occiput Anterior).  APGAR: 8, 9; weight 6 lb 11.6 oz (3050 g).   Placenta status: delivered spontaneously, intact.  Cord: 3 vessels with the following complications: None.  Anesthesia: Epidural  Episiotomy: none Lacerations: none Est. Blood Loss (mL): 300 mL  Mom to postpartum.  Baby to nursery-stable.  STINSON, JACOB JEHIEL 01/06/2012, 9:14 PM

## 2011-12-17 ENCOUNTER — Telehealth (HOSPITAL_COMMUNITY): Payer: Self-pay | Admitting: *Deleted

## 2011-12-17 ENCOUNTER — Ambulatory Visit (INDEPENDENT_AMBULATORY_CARE_PROVIDER_SITE_OTHER): Payer: Self-pay | Admitting: Obstetrics & Gynecology

## 2011-12-17 ENCOUNTER — Encounter: Payer: Medicaid Other | Attending: Obstetrics and Gynecology | Admitting: Dietician

## 2011-12-17 ENCOUNTER — Other Ambulatory Visit: Payer: Self-pay | Admitting: Family Medicine

## 2011-12-17 VITALS — Temp 97.5°F | Wt 173.1 lb

## 2011-12-17 DIAGNOSIS — Z713 Dietary counseling and surveillance: Secondary | ICD-10-CM | POA: Insufficient documentation

## 2011-12-17 DIAGNOSIS — O24419 Gestational diabetes mellitus in pregnancy, unspecified control: Secondary | ICD-10-CM

## 2011-12-17 DIAGNOSIS — O99810 Abnormal glucose complicating pregnancy: Secondary | ICD-10-CM | POA: Insufficient documentation

## 2011-12-17 LAB — POCT URINALYSIS DIP (DEVICE)
Glucose, UA: NEGATIVE mg/dL
Nitrite: NEGATIVE
Protein, ur: NEGATIVE mg/dL
Specific Gravity, Urine: 1.02 (ref 1.005–1.030)
Urobilinogen, UA: 0.2 mg/dL (ref 0.0–1.0)

## 2011-12-17 MED ORDER — FLUCONAZOLE 100 MG PO TABS: 150.0000 mg | ORAL_TABLET | Freq: Every day | ORAL | Status: AC

## 2011-12-17 NOTE — Progress Notes (Signed)
Pulse- 75.  Pain-lower abd.  Pt c/o itching on the outside of vagina. 96, 98, 96, 97, 69, 64, 61, 64, 112, 98, 75;   Pp br121, 155,122 16,109,60,454,098,119,147;  2 hr pp lunch 137,93,82,89,161,109,138;  2 hr dinner = 182,158,126,104,95,134,134.  Pt getting culture and wet prep today.  Needs 2x/wk testing.  [place] for growth at 38 1/2 weeks.  Induce at 39 weeks.

## 2011-12-17 NOTE — Progress Notes (Signed)
I documented i Jeanetta's note.  See below.  Pt to meet with are: snacks at night.  Discharge and symoptoms c/w yeast.  Rx with Diflucan.

## 2011-12-17 NOTE — Progress Notes (Signed)
IOL scheduled 01/05/12 at 730 pm.

## 2011-12-17 NOTE — Patient Instructions (Signed)
Amamantar al beb (Breastfeeding) LOS BENEFICIOS DE AMAMANTAR Para el beb  La primera leche (calostro ) ayuda al mejor funcionamiento del sistema digestivo del beb.   La leche tiene anticuerpos que provienen de la madre y que ayudan a prevenir las infecciones en el beb.   Hay una menor incidencia de asma, enfermedades alrgicas y SMSI (sndrome de muerte sbita nfantil).   Los nutrientes que contiene la [place] materna son mejores que las frmulas para el bibern y favorecen el desarrollo cerebral.   Los bebs amamantados sufren menos gases, clicos y constipacin.  Para la mam  La lactancia materna favorece el desarrollo de un vnculo muy especial entre la madre y el beb.   Es ms conveniente, siempre disponible a la Optician, dispensing y ms econmica que la [employer]   Consume caloras en la madre y la ayuda a perder el peso ganado durante el [place].   Favorece la contraccin del tero a su tamao normal, de manera ms rpida y [employer] las hemorragias luego del [place].   Las [employer] Holdings que amamantan tienen menor riesgo de Geophysical data processor de mama.  AMAMNTELO CON FRECUENCIA  Un beb sano, nacido a trmino, puede amamantarse con tanta frecuencia como cada hora, o espaciar las comidas cada tres horas.   Esta frecuencia variar de un beb a otro. Observe al beb cuando manifieste signos de hambre, antes que regirse por el reloj.   Amamntelo tan seguido como el beb lo solicite, o cuando usted sienta la necesidad de Paramedic sus [place].   Despierte al beb si han pasado 3  4 horas desde la ltima comida.   El amamantamiento frecuente la ayudar a producir ms [place] y a Education officer, community de Engineer, mining en los pezones e hinchazn de las [place].  LA POSICIN DEL BEB PARA AMAMANTARLO  Ya sea que se encuentre acostada o sentada, asegrese que el abdomen del beb enfrente el suyo.   Sostenga la mama con el pulgar por arriba y el resto de los dedos por debajo. Asegrese que  sus dedos se encuentren lejos del pezn y de la boca del beb.   Toque suavemente los labios del beb y la mejilla ms cercana a la mama con el dedo o el pezn.   Cuando la boca del beb se abra lo suficiente, introduzca el pezn y la zona oscura que lo rodea tanto como le sea posible dentro de la boca.   Coloque a beb cerca suyo de modo que su nariz y mejillas toquen las mamas al [employer].  LAS COMIDAS  La duracin de cada comida vara de un beb a otro y de [place] comida a [place].   El beb debe succionar alrededor American Financial o tres minutos para que le llegue [place]. Esto se denomina "bajada". Por este motivo, permita que el nio se alimente en cada mama todo lo que desee. Terminar de mamar cuando haya recibido la cantidad [place] de nutrientes.   Para detener la succin coloque su dedo en la comisura de la boca del nio y Midwife entre sus encas antes de quitarle la mama de la boca. Esto la ayudar a English as a second language teacher.  REDUCIR LA CONGESTIN DE LAS MAMAS  Durante la primera semana despus del parto, usted puede experimentar Mons[employer] Company. Cuando las mamas estn congestionadas, se sienten calientes, llenas y molestas al tacto. Puede reducir la congestin si:   Lo amamanta frecuentemente, cada 2-3 horas. Las mams que [employer] pronto y con frecuencia tienen menos problemas  de congestin.   Coloque bolsas fras livianas entre cada [place]. Esto ayuda a Building services engineer. Envuelva las bolsas de hielo en una toalla liviana para proteger su piel.   Aplique compresas hmedas calientes [employer] W[employer]ley [employer] Company durante 5 a 10 minutos antes de amamantar al [employer]. Esto aumenta la circulacin y [place] a que la Berger.   Masajee suavemente la mama antes y Psychologist, sport and exercise.   Asegrese que el nio vaca al menos una mama antes de cambiar de lado.   Use un sacaleche para vaciar la mama si el beb se duerme o no se alimenta bien. Tambin podr Phel[employer] con esta bomba si  tiene que volver al trabajo o siente que las mamas estn congestionadas.   Evite los biberones, chupetes o complementar la alimentacin con agua o jugos en lugar de la [place].   Verifique que el beb se encuentra en la posicin correcta mientras lo alimenta.   Evite el cansancio, el estrs y la anemia   Use un soutien que sostenga bien sus mamas y evite los que tienen aro.   Consuma una dieta balanceada y beba lquidos en cantidad.  Si sigue estas indicaciones, la congestin debe mejorar en 24 a 48 horas. Si an tiene dificultades, consulte a Barista. TENDR SUFICIENTE LECHE MI BEB? Algunas veces las madres se preocupan acerca de si sus bebs tendrn la leche suficiente. Puede asegurarse que el beb tiene la leche suficiente si:  El beb succiona y escucha que traga activamente.   El nio se alimenta al menos 8 a 12 veces en 24 horas. Alimntelo hasta que se desprenda por sus propios medios o se quede dormido en la primera mama (al menos durante 10 a 20 minutos), luego ofrzcale el otro lado.   El beb moja 5 a 6 paales descartables (6 a 8 paales de tela) en 24 horas cuando tiene 5  6 das de vida.   Tiene al menos 2-3 deposiciones todos los [employer] and [employer] primeros meses. La leche materna es todo el alimento que el beb necesita. No es necesario que el nio ingiera agua o preparados de bibern. De hecho, para ayudar a que sus mamas produzcan ms [place], lo mejor es no darle al beb suplementos durante las primeras semanas.   La materia fecal debe ser blanda y [place].   El beb debe aumentar 112 a 196 g por semana.  CUDESE Cuide sus mamas del siguiente modo:  Bese o dchese diariamente.   No lave sus pezones con jabn.   Comience a amamantar del lado izquierdo en una comida y del lado derecho en la siguiente.   Notar que [employer] suministro de [place] a los 2 a 5 [address]  [address] despus del [place]. Puede sentir algunas molestias por la congestin, lo que hace que  sus mamas estn duras y sensibles. La congestin disminuye en 24 a 48 horas. Mientras tanto, aplique toallas hmedas calientes durante 5 a 10 minutos antes de amamantar. Un masaje suave y la extraccin de un poco de leche antes de Museum/gallery exhibitions officer ablandarn las mamas y har ms fcil que el beb se agarre. Use un buen sostn y seque al aire los pezones durante 10 a 15 minutos luego de cada alimentacin.   Solo utilice apsitos de algodn.   Utilice lanolina [employer] pezones luego de [place]. No necesita lavarlos luego de alimentar al [employer].  Cudese del siguiente modo:   Consuma alimentos bien balanceados y refrigerios nutritivos.  Dixie Dials, jugos de fruta y agua para Warehouse manager sed (alrededor de 8 vasos por Futures trader).   Descanse lo suficiente.   Aumente la ingesta de calcio en la dieta (1200mg /da).   Evite los alimentos que usted nota que puedan afectar al beb.  SOLICITE ATENCIN MDICA SI:  Tiene preguntas que formular o dificultades con la alimentacin a pecho.   Necesita ayuda.   Observa una zona dura, roja y que le duele en la zona de la mama, y se acompaa de fiebre de 100.5 F (38.1 C) o ms.   El beb est muy somnoliento como para alimentarse bien o tiene problemas para dormir.   El beb moja menos de 6 paales por da, a partir de los [address] de [place].   La piel del beb o la parte blanca de sus ojos est ms amarilla de lo que estaba en el hospital.   Se siente deprimida.  Document Released: 11/26/2005 Document Revised: 08/08/2011 [facility] Patient Information 2012 [place], [place].

## 2011-12-17 NOTE — Progress Notes (Signed)
Diabetes Education:  Having high fasting levels.  Started the glyburide on Dec 31.  Not always having a bedtime snack.  Reviewed the need to use the glucose results to monitor the effects of meals.  Encouraged her to have the bedtime snack of protein plus a snack.  Provided some examples of protein and tortilla, yogurt and nuts etc.  Maggie May, RN, RD, CDE

## 2011-12-18 LAB — GC/CHLAMYDIA PROBE AMP, GENITAL
Chlamydia, DNA Probe: NEGATIVE
GC Probe Amp, Genital: NEGATIVE

## 2011-12-19 LAB — WET PREP, GENITAL

## 2011-12-19 NOTE — Telephone Encounter (Signed)
Preadmission screen  

## 2011-12-20 ENCOUNTER — Ambulatory Visit (INDEPENDENT_AMBULATORY_CARE_PROVIDER_SITE_OTHER): Payer: Self-pay | Admitting: *Deleted

## 2011-12-20 VITALS — BP 118/64 | Wt 172.7 lb

## 2011-12-20 DIAGNOSIS — O99810 Abnormal glucose complicating pregnancy: Secondary | ICD-10-CM

## 2011-12-20 DIAGNOSIS — O24419 Gestational diabetes mellitus in pregnancy, unspecified control: Secondary | ICD-10-CM

## 2011-12-20 LAB — CULTURE, BETA STREP (GROUP B ONLY)

## 2011-12-20 NOTE — Progress Notes (Signed)
P= 80 

## 2011-12-21 ENCOUNTER — Telehealth (HOSPITAL_COMMUNITY): Payer: Self-pay | Admitting: *Deleted

## 2011-12-24 ENCOUNTER — Encounter: Payer: Medicaid Other | Admitting: Dietician

## 2011-12-24 ENCOUNTER — Other Ambulatory Visit: Payer: Self-pay | Admitting: Obstetrics and Gynecology

## 2011-12-24 ENCOUNTER — Ambulatory Visit (INDEPENDENT_AMBULATORY_CARE_PROVIDER_SITE_OTHER): Payer: Self-pay | Admitting: Obstetrics and Gynecology

## 2011-12-24 VITALS — BP 118/76 | Temp 97.7°F | Wt 171.9 lb

## 2011-12-24 DIAGNOSIS — O99810 Abnormal glucose complicating pregnancy: Secondary | ICD-10-CM

## 2011-12-24 DIAGNOSIS — O0990 Supervision of high risk pregnancy, unspecified, unspecified trimester: Secondary | ICD-10-CM

## 2011-12-24 DIAGNOSIS — O24419 Gestational diabetes mellitus in pregnancy, unspecified control: Secondary | ICD-10-CM

## 2011-12-24 LAB — POCT URINALYSIS DIP (DEVICE)
Hgb urine dipstick: NEGATIVE
Protein, ur: 30 mg/dL — AB
Specific Gravity, Urine: >=1.03 (ref 1.005–1.030)
pH: 6 (ref 5.0–8.0)

## 2011-12-24 NOTE — Progress Notes (Signed)
P=80 , Used [employer], had tetanus shot 4 years ago

## 2011-12-24 NOTE — Progress Notes (Signed)
Diabetes Education:  Provided One box glucose strips Lot [id number] Expiration: 2014/04/07

## 2011-12-24 NOTE — Progress Notes (Signed)
NST 1/14 reviewed-category I tracing. CBG fasting- 62-88 2hr p B 70-134 (1/5 abnormal) 2hr pp L 20-120; 2hr pp D 106, 108 ( 2 values recorded). Patient doing well without complaints. Advised to check CBG regularly. F/u growth ultrasound scheduled. FM/Labor precautions reviewed

## 2011-12-24 NOTE — Progress Notes (Signed)
U/S scheduled 12/28/11 at 230 pm.

## 2011-12-27 ENCOUNTER — Ambulatory Visit (INDEPENDENT_AMBULATORY_CARE_PROVIDER_SITE_OTHER): Payer: Medicaid Other | Admitting: *Deleted

## 2011-12-27 ENCOUNTER — Ambulatory Visit (HOSPITAL_COMMUNITY)
Admission: RE | Admit: 2011-12-27 | Discharge: 2011-12-27 | Disposition: A | Discharge status: Home / Self Care | Payer: Medicaid Other | Source: Ambulatory Visit | Attending: Obstetrics and Gynecology | Admitting: Obstetrics and Gynecology

## 2011-12-27 VITALS — BP 122/62

## 2011-12-27 DIAGNOSIS — O99810 Abnormal glucose complicating pregnancy: Secondary | ICD-10-CM

## 2011-12-27 DIAGNOSIS — O24419 Gestational diabetes mellitus in pregnancy, unspecified control: Secondary | ICD-10-CM

## 2011-12-27 NOTE — Progress Notes (Signed)
P = 81   NST only today.  [place] growth today.

## 2011-12-28 ENCOUNTER — Ambulatory Visit (HOSPITAL_COMMUNITY): Payer: Self-pay

## 2011-12-31 ENCOUNTER — Ambulatory Visit (INDEPENDENT_AMBULATORY_CARE_PROVIDER_SITE_OTHER): Payer: Self-pay | Admitting: Obstetrics and Gynecology

## 2011-12-31 VITALS — BP 119/77 | HR 78 | Temp 97.6°F | Wt 174.5 lb

## 2011-12-31 DIAGNOSIS — O99810 Abnormal glucose complicating pregnancy: Secondary | ICD-10-CM

## 2011-12-31 DIAGNOSIS — O0990 Supervision of high risk pregnancy, unspecified, unspecified trimester: Secondary | ICD-10-CM

## 2011-12-31 DIAGNOSIS — O24419 Gestational diabetes mellitus in pregnancy, unspecified control: Secondary | ICD-10-CM

## 2011-12-31 LAB — POCT URINALYSIS DIP (DEVICE)
Bilirubin Urine: NEGATIVE
Hgb urine dipstick: NEGATIVE
Ketones, ur: 40 mg/dL — AB
pH: 6.5 (ref 5.0–8.0)

## 2011-12-31 NOTE — Patient Instructions (Signed)
Induccin del trabajo de parto (Labor Induction) Janne Napoleon embarazada por lo general hace el trabajo de parto de manera espontnea antes del nacimiento del beb. La [employer] de los bebs [employer] las 37 y 42 semanas de Psychiatrist. Cuando esto no sucede, los profesionales [employer] u otros mtodos para Contractor (inducir) Engineer, manufacturing systems. La induccin del parto hace que el tero de la mujer se [place], el cuello del tero se abra (dilate) y se afine para prepararse para el parto del beb por va vaginal. Pueden utilizarse varios mtodos para la induccin del parto como:  Engineer, maintenance (IT) el pezn y la areola de los pechos (estimulacin del pezn).   Prostaglandina por va oral o como crema vaginal.   Quitar las membranas (el mdico inserta un dedo entre el cuello del tero y las membranas que rodean la cabeza del beb) para que el cuerpo produzca prostaglandina que afloja el cuello del tero y produce la contraccin del tero.   Ruptura de la bolsa de agua (amniotoma).   Oxitocina por va intravenosa.   Se colocan dilatadores especiales en el canal cervical que ocasionan que el tero se ablande y se abra.   Dispositivos mecnicos para abrir el cuello del tero como el catter de foley.  Si el trabajo de parto ser inducido o no depende de su estado y el de su beb, de cuanto tiempo est, la madurez de los pulmones del beb, el estado del cuello del tero, la forma en que el beb esta acomodado en la panza y otros factores. Por lo general, el trabajo de parto no se induce antes de las [address] de embarazo a menos que haya un problema con el beb o la [place], y sea Occupational psychologist. RAZONES POR LAS CUALES DEBE INDUCIRSE EL TRABAJO DE PARTO  La salud del beb o de la madre estn en riesgo.   El plazo para el parto se ha pasado 2 semanas o ms.   Se rompe la bolsa (ruptura prematura de las [place]), los pulmones del beb estn maduros y [place] de parto no comienza.   Presenta presin  arterial alta (toxemia de embarazo).   Presenta una infeccin en el tero.   Tiene diabetes u otras enfermedades graves.   La cantidad de lquido amnitico es poca.   La placenta comienza a separarse de la pared interna del tero antes de que nazca el beb (abrupcin placentaria). Este trastorno puede requerir una cesrea de Associate Professor.   Existe muerte fetal.   La induccin social tambin se denomina induccin por conveniencia. En la [employer], el [place] de parto se induce por razones mdicas de [place]. A veces, se realiza por conveniencia. Vivir lejos del hospital o tener una historia de partos rpidos podran ser razones por las cuales la madre podra querer inducir el [place].  RAZONES POR LAS CUALES NO DEBE INDUCIRSE EL TRABAJO DE PARTO  Le han practicado una ciruga de tero anteriormente. Sobre todo si la [place] se ha realizado en la [place] interna y la cavidad del tero. Esto le proporciona un alto riesgo de romper el tero.   Diagnstico de placenta previa. Esto significa que la placenta est muy baja en el tero y obstruye la apertura (cuello del tero) por donde debe salir el beb.   El beb no est en posicin cabeza abajo. Por ejemplo, el beb est cruzado (transverso) en vez de con la cabeza hacia abajo.   Si el cordn umbilical cae hacia abajo por el canal de  parto frente al beb. Esto podra cortar el suministro de sangre y oxgeno al beb.  RIESGOS Y COMPLICACIONES DEL PROCEDIMIENTO Casi nunca ocurren problemas con la induccin del [place], pero puede haber algunas complicaciones. Entre los riesgos se incluyen:  Cambios en el ritmo cardaco fetal (muy alto, muy bajo o irregular).   Aumento del riesgo de parto prematuro, incluso si cree que el beb est en trmino.   Aumento de riesgo de complicaciones para el feto. Esto significa que el beb podra tener problemas durante la induccin. Esto puede causarse porque el cordn umbilical pasa frente al beb o est  comprimido.   Aumento de riesgo de infeccin de la madre y el beb.   Aumento de la probabilidad de tener un parto por cesrea. Esto es una operacin del abdomen (vientre) para sacar al beb.   Las contracciones fuertes pueden llevar a la abrupcin. Esto es una separacin de la placenta de la pared del tero.   La ruptura uterina, en especial si ha tenido una cesrea previa o [place] del tero.  Cuando el parto est inducido por problemas mdicos, puede haber otros riesgos. La induccin del parto puede llevar a:  Aumento del uso de medicamentos para Engineer, materials.   Otras intervenciones.  Cuando es necesaria la induccin por motivos mdicos, los beneficios de sta podran [employer] Corporation. PROCEDIMIENTO DE LA INDUCCIN Puede tomar hasta 2  3 [address]  [address] la induccin del [place] de [place]. Generalmente toma menos tiempo. Toma ms tiempo cuanto menos evolucionado est el embarazo o con los primeros embarazos.  Antes de concurrir al hospital para la induccin:  No debe comer nada durante las 8 horas previas a concurrir al hospital.   No coma despus de la medianoche si van a realizarle la induccin por la maana temprano.   Sepa que muchos medicamentos para la induccin de parto pueden ocasionarle Programme researcher, broadcasting/film/video.   Hable con su mdico si necesita un medicamento para Chief Technology Officer.  INSTRUCCIONES PARA EL CUIDADO DOMICILIARIO Si le han inducido el trabajo de parto en el consultorio y se le permite ir a casa, siga las instrucciones del mdico. SOLICITE ATENCIN MDICA DE INMEDIATO SI:  Presenta algn tipo de hemorragia vaginal.   Presenta contracciones intensas y continuas.   Si se siente confundido o mareado.   No tiene contracciones en el tiempo que el mdico le indica que debera.   Presenta una temperatura oral superior a 100 F (37.8 C) o escalofros.   Ya no puede sentir los [employer] del beb.  Document Released: 03/04/2008 Document Revised: 08/08/2011 [facility]  Patient Information 2012 [place], [place].

## 2011-12-31 NOTE — Progress Notes (Signed)
1/21 NSt reviewed- category I tracing. CBG fasting- 74-92 2hr p Bk 70-118; 2hr p L 110-132 (1 abnormal); 2hr p D 115-140 (1 abnormal). Patient scheduled for IOL 1/26. FM/labor precautions reviewed. Patient planning on using depo-provera and possibly implanon for birth control. Patient initially desired BTL but cannot afford it. Patient informed that partner can receive funding for vasectomy.

## 2011-12-31 NOTE — Progress Notes (Signed)
Pressure in pelvic area. No pain.

## 2012-01-03 ENCOUNTER — Ambulatory Visit (INDEPENDENT_AMBULATORY_CARE_PROVIDER_SITE_OTHER): Payer: Medicaid Other | Admitting: *Deleted

## 2012-01-03 VITALS — BP 123/66

## 2012-01-03 DIAGNOSIS — O24419 Gestational diabetes mellitus in pregnancy, unspecified control: Secondary | ICD-10-CM

## 2012-01-03 DIAGNOSIS — O99810 Abnormal glucose complicating pregnancy: Secondary | ICD-10-CM

## 2012-01-03 NOTE — Progress Notes (Signed)
P = 79   NST/AFI only today.   IOL scheduled on 01/05/12

## 2012-01-05 ENCOUNTER — Encounter (HOSPITAL_COMMUNITY): Payer: Self-pay

## 2012-01-05 ENCOUNTER — Inpatient Hospital Stay (HOSPITAL_COMMUNITY)
Admission: RE | Admit: 2012-01-05 | Discharge: 2012-01-08 | DRG: 775 | Disposition: A | Discharge status: Home / Self Care | Payer: Medicaid Other | Source: Ambulatory Visit | Attending: Obstetrics & Gynecology | Admitting: Obstetrics & Gynecology

## 2012-01-05 DIAGNOSIS — O0990 Supervision of high risk pregnancy, unspecified, unspecified trimester: Secondary | ICD-10-CM

## 2012-01-05 DIAGNOSIS — O99814 Abnormal glucose complicating childbirth: Principal | ICD-10-CM | POA: Diagnosis present

## 2012-01-05 DIAGNOSIS — O24419 Gestational diabetes mellitus in pregnancy, unspecified control: Secondary | ICD-10-CM

## 2012-01-05 LAB — CBC
MCV: 79.1 fL (ref 78.0–100.0)
Platelets: 207 10*3/uL (ref 150–400)
RBC: 4.36 MIL/uL (ref 3.87–5.11)
WBC: 6.6 10*3/uL (ref 4.0–10.5)

## 2012-01-05 MED ORDER — ZOLPIDEM TARTRATE 10 MG PO TABS
10.0000 mg | ORAL_TABLET | Freq: Every evening | ORAL | Status: DC | PRN
  Administered 2012-01-05: 10 mg via ORAL
  Filled 2012-01-05: qty 1

## 2012-01-05 MED ORDER — OXYCODONE-ACETAMINOPHEN 5-325 MG PO TABS: 2.0000 | ORAL_TABLET | ORAL | Status: DC | PRN

## 2012-01-05 MED ORDER — CITRIC ACID-SODIUM CITRATE 334-500 MG/5ML PO SOLN: 30.0000 mL | ORAL | Status: DC | PRN

## 2012-01-05 MED ORDER — GLYBURIDE 2.5 MG PO TABS
2.5000 mg | ORAL_TABLET | Freq: Two times a day (BID) | ORAL | Status: DC
  Filled 2012-01-05: qty 1

## 2012-01-05 MED ORDER — IBUPROFEN 600 MG PO TABS: 600.0000 mg | ORAL_TABLET | Freq: Four times a day (QID) | ORAL | Status: DC | PRN

## 2012-01-05 MED ORDER — GLYBURIDE 2.5 MG PO TABS
2.5000 mg | ORAL_TABLET | Freq: Two times a day (BID) | ORAL | Status: DC
  Administered 2012-01-05 – 2012-01-06 (×2): 2.5 mg via ORAL
  Filled 2012-01-05 (×4): qty 1

## 2012-01-05 MED ORDER — LIDOCAINE HCL (PF) 1 % IJ SOLN
30.0000 mL | INTRAMUSCULAR | Status: DC | PRN
  Filled 2012-01-05: qty 30

## 2012-01-05 MED ORDER — MISOPROSTOL 25 MCG QUARTER TABLET
25.0000 ug | ORAL_TABLET | ORAL | Status: DC | PRN
  Administered 2012-01-05 – 2012-01-06 (×3): 25 ug via VAGINAL
  Filled 2012-01-05 (×4): qty 0.25

## 2012-01-05 MED ORDER — FLEET ENEMA 7-19 GM/118ML RE ENEM: 1.0000 | ENEMA | RECTAL | Status: DC | PRN

## 2012-01-05 MED ORDER — ACETAMINOPHEN 325 MG PO TABS: 650.0000 mg | ORAL_TABLET | ORAL | Status: DC | PRN

## 2012-01-05 MED ORDER — LACTATED RINGERS IV SOLN
INTRAVENOUS | Status: DC
  Administered 2012-01-05 – 2012-01-06 (×4): via INTRAVENOUS

## 2012-01-05 MED ORDER — ONDANSETRON HCL 4 MG/2ML IJ SOLN: 4.0000 mg | Freq: Four times a day (QID) | INTRAMUSCULAR | Status: DC | PRN

## 2012-01-05 MED ORDER — OXYTOCIN BOLUS FROM INFUSION
500.0000 mL | Freq: Once | INTRAVENOUS | Status: DC
  Filled 2012-01-05: qty 500

## 2012-01-05 MED ORDER — OXYTOCIN 20 UNITS IN LACTATED RINGERS INFUSION - SIMPLE
125.0000 mL/h | Freq: Once | INTRAVENOUS | Status: AC
  Administered 2012-01-06: 999 mL/h via INTRAVENOUS

## 2012-01-05 MED ORDER — TERBUTALINE SULFATE 1 MG/ML IJ SOLN: 0.2500 mg | Freq: Once | INTRAMUSCULAR | Status: AC | PRN

## 2012-01-05 MED ORDER — LACTATED RINGERS IV SOLN: 500.0000 mL | INTRAVENOUS | Status: DC | PRN

## 2012-01-05 NOTE — H&P (Signed)
Jade Potts is a 36 y.o. female presenting for Induction of Labor secondary to GDM  Patient has no complaints today, reports good fetal movement.   [facility], Pregnancy complicated by gesatational DM. Maternal labs negative, GBS negative GTT: 1Hr 189 2Hr 175 3Hr 141. Last US showed est fetal weight 7.3lbs on 1/17   Maternal Medical History:  Contractions: Frequency: irregular.   Perceived severity is mild.    Fetal activity: Perceived fetal activity is normal.   Last perceived fetal movement was within the past hour.    Prenatal Complications - Diabetes: gestational. Diabetes is managed by oral agent (monotherapy).      OB History    Grav Para Term Preterm Abortions TAB SAB Ect Mult Living   4 3 3  0 0 0 0 0 0 3     Past Medical History  Diagnosis Date  . No pertinent past medical history   . Gestational diabetes     1st and 2nd pregnancy  . Anemia     1st pregnancy   No past surgical history on file. Family History: family history includes Diabetes in her mother and Hypertension in her sister. Social History:  reports that she has never smoked. She has never used smokeless tobacco. She reports that she does not drink alcohol or use illicit drugs.  Review of Systems  Constitutional: Negative for fever and chills.  Eyes: Negative for blurred vision and double vision.  Respiratory: Negative for shortness of breath.   Cardiovascular: Negative for chest pain.  Neurological: Negative for headaches.  All other systems reviewed and are negative.    Dilation: 1 Effacement (%): 50 Station: Ballotable Exam by:: C. Blackstock, RN Blood pressure 133/71, pulse 85, temperature 97.9 F (36.6 C), temperature source Oral, resp. rate 20, height 5' (1.524 m), weight 174 lb (78.926 kg). Exam Physical Exam  Nursing note and vitals reviewed. Constitutional: She is oriented to person, place, and time. She appears well-developed and well-nourished.  No distress.  HENT:  Head: Normocephalic and atraumatic.  Eyes: EOM are normal. Pupils are equal, round, and reactive to light.  Cardiovascular: Normal rate, regular rhythm, normal heart sounds and intact distal pulses.  Exam reveals no gallop and no friction rub.   No murmur heard. Respiratory: Effort normal and breath sounds normal. No respiratory distress. She has no wheezes. She exhibits no tenderness.  GI: Soft.  Musculoskeletal: She exhibits no edema and no tenderness.  Neurological: She is alert and oriented to person, place, and time. She has normal reflexes. No cranial nerve deficit. Coordination normal.  Skin: Skin is warm and dry. No rash noted. She is not diaphoretic. No erythema. No pallor.  Psychiatric: She has a normal mood and affect. Her behavior is normal. Judgment and thought content normal.    Prenatal labs: ABO, Rh: O/POS/-- (08/01 0957) Antibody: NEG (08/01 0957) Rubella: >500.0 (08/01 0957) RPR: NON REAC (08/01 0957)  HBsAg: NEGATIVE (08/01 0957)  HIV: NON REACTIVE (08/01 0957)  GBS:   negative  Assessment/Plan: Induction of Labor  - admit to labor and delivery  - cervical ripening with cytotec then pitocin  - epidural prn  - category one fetal heart tracing  - expectant management  - anticipate vaginal delivery  Gestational Diabetes  - sugars controlled  - hold glyburide for now, restart postpartum  - continue to monior  Mother plans to breast/bottle feed, considering depoprovera      Cameron Proud 01/05/2012, 9:39 PM

## 2012-01-06 ENCOUNTER — Inpatient Hospital Stay (HOSPITAL_COMMUNITY): Payer: Medicaid Other | Admitting: Anesthesiology

## 2012-01-06 ENCOUNTER — Encounter (HOSPITAL_COMMUNITY): Payer: Self-pay | Admitting: Anesthesiology

## 2012-01-06 DIAGNOSIS — O99814 Abnormal glucose complicating childbirth: Secondary | ICD-10-CM

## 2012-01-06 LAB — RPR: RPR Ser Ql: NONREACTIVE

## 2012-01-06 MED ORDER — EPHEDRINE 5 MG/ML INJ: 10.0000 mg | INTRAVENOUS | Status: DC | PRN

## 2012-01-06 MED ORDER — WITCH HAZEL-GLYCERIN EX PADS: 1.0000 "application " | MEDICATED_PAD | CUTANEOUS | Status: DC | PRN

## 2012-01-06 MED ORDER — ONDANSETRON HCL 4 MG PO TABS: 4.0000 mg | ORAL_TABLET | ORAL | Status: DC | PRN

## 2012-01-06 MED ORDER — BENZOCAINE-MENTHOL 20-0.5 % EX AERO: 1.0000 "application " | INHALATION_SPRAY | CUTANEOUS | Status: DC | PRN

## 2012-01-06 MED ORDER — LACTATED RINGERS IV SOLN
500.0000 mL | Freq: Once | INTRAVENOUS | Status: AC
  Administered 2012-01-06: 500 mL via INTRAVENOUS

## 2012-01-06 MED ORDER — IBUPROFEN 600 MG PO TABS
600.0000 mg | ORAL_TABLET | Freq: Four times a day (QID) | ORAL | Status: DC
  Administered 2012-01-06 – 2012-01-08 (×7): 600 mg via ORAL
  Filled 2012-01-06 (×6): qty 1

## 2012-01-06 MED ORDER — FENTANYL 2.5 MCG/ML BUPIVACAINE 1/10 % EPIDURAL INFUSION (WH - ANES)
14.0000 mL/h | INTRAMUSCULAR | Status: DC
  Administered 2012-01-06 (×2): 14 mL/h via EPIDURAL
  Filled 2012-01-06 (×2): qty 60

## 2012-01-06 MED ORDER — LIDOCAINE HCL 1.5 % IJ SOLN
INTRAMUSCULAR | Status: DC | PRN
  Administered 2012-01-06 (×2): 5 mL via EPIDURAL

## 2012-01-06 MED ORDER — OXYCODONE-ACETAMINOPHEN 5-325 MG PO TABS
1.0000 | ORAL_TABLET | ORAL | Status: DC | PRN
  Administered 2012-01-07 (×2): 1 via ORAL
  Filled 2012-01-06 (×2): qty 1

## 2012-01-06 MED ORDER — ZOLPIDEM TARTRATE 5 MG PO TABS: 5.0000 mg | ORAL_TABLET | Freq: Every evening | ORAL | Status: DC | PRN

## 2012-01-06 MED ORDER — ONDANSETRON HCL 4 MG/2ML IJ SOLN: 4.0000 mg | INTRAMUSCULAR | Status: DC | PRN

## 2012-01-06 MED ORDER — TETANUS-DIPHTH-ACELL PERTUSSIS 5-2.5-18.5 LF-MCG/0.5 IM SUSP
0.5000 mL | Freq: Once | INTRAMUSCULAR | Status: AC
  Administered 2012-01-07: 0.5 mL via INTRAMUSCULAR
  Filled 2012-01-06: qty 0.5

## 2012-01-06 MED ORDER — SENNOSIDES-DOCUSATE SODIUM 8.6-50 MG PO TABS
2.0000 | ORAL_TABLET | Freq: Every day | ORAL | Status: DC
  Administered 2012-01-07: 2 via ORAL

## 2012-01-06 MED ORDER — DIPHENHYDRAMINE HCL 25 MG PO CAPS: 25.0000 mg | ORAL_CAPSULE | Freq: Four times a day (QID) | ORAL | Status: DC | PRN

## 2012-01-06 MED ORDER — PRENATAL MULTIVITAMIN CH
1.0000 | ORAL_TABLET | Freq: Every day | ORAL | Status: DC
  Administered 2012-01-07 – 2012-01-08 (×2): 1 via ORAL
  Filled 2012-01-06 (×2): qty 1

## 2012-01-06 MED ORDER — DIPHENHYDRAMINE HCL 50 MG/ML IJ SOLN: 12.5000 mg | INTRAMUSCULAR | Status: DC | PRN

## 2012-01-06 MED ORDER — EPHEDRINE 5 MG/ML INJ
10.0000 mg | INTRAVENOUS | Status: DC | PRN
  Filled 2012-01-06: qty 4

## 2012-01-06 MED ORDER — PHENYLEPHRINE 40 MCG/ML (10ML) SYRINGE FOR IV PUSH (FOR BLOOD PRESSURE SUPPORT)
80.0000 ug | PREFILLED_SYRINGE | INTRAVENOUS | Status: DC | PRN
  Filled 2012-01-06: qty 5

## 2012-01-06 MED ORDER — SIMETHICONE 80 MG PO CHEW: 80.0000 mg | CHEWABLE_TABLET | ORAL | Status: DC | PRN

## 2012-01-06 MED ORDER — OXYTOCIN 20 UNITS IN LACTATED RINGERS INFUSION - SIMPLE
1.0000 m[IU]/min | INTRAVENOUS | Status: DC
  Administered 2012-01-06: 2 m[IU]/min via INTRAVENOUS
  Filled 2012-01-06: qty 1000

## 2012-01-06 MED ORDER — NALBUPHINE SYRINGE 5 MG/0.5 ML
5.0000 mg | INJECTION | INTRAMUSCULAR | Status: DC | PRN
  Administered 2012-01-06: 5 mg via INTRAVENOUS
  Filled 2012-01-06: qty 0.5
  Filled 2012-01-06: qty 1

## 2012-01-06 MED ORDER — DIBUCAINE 1 % RE OINT: 1.0000 "application " | TOPICAL_OINTMENT | RECTAL | Status: DC | PRN

## 2012-01-06 MED ORDER — LANOLIN HYDROUS EX OINT: TOPICAL_OINTMENT | CUTANEOUS | Status: DC | PRN

## 2012-01-06 MED ORDER — PHENYLEPHRINE 40 MCG/ML (10ML) SYRINGE FOR IV PUSH (FOR BLOOD PRESSURE SUPPORT): 80.0000 ug | PREFILLED_SYRINGE | INTRAVENOUS | Status: DC | PRN

## 2012-01-06 MED ORDER — TERBUTALINE SULFATE 1 MG/ML IJ SOLN: 0.2500 mg | Freq: Once | INTRAMUSCULAR | Status: DC | PRN

## 2012-01-06 NOTE — H&P (Signed)
Attestation of Attending Supervision of Resident: Evaluation and management procedures were performed by the [facility] Resident under my supervision.  I have reviewed the resident's note, chart reviewed and agree with management and plan.  Jaynie Collins, M.D. 01/06/2012 7:36 AM

## 2012-01-06 NOTE — Anesthesia Procedure Notes (Signed)
Epidural Patient location during procedure: OB Start time: 01/06/2012 4:52 PM  Staffing Anesthesiologist: Brayton Caves R Performed by: anesthesiologist   Preanesthetic Checklist Completed: patient identified, site marked, surgical consent, pre-op evaluation, timeout performed, IV checked, risks and benefits discussed and monitors and equipment checked  Epidural Patient position: sitting Prep: site prepped and draped and DuraPrep Patient monitoring: continuous pulse ox and blood pressure Approach: midline Injection technique: LOR air and LOR saline  Needle:  Needle type: Tuohy  Needle gauge: 17 G Needle length: 9 cm Needle insertion depth: 6 cm Catheter type: closed end flexible Catheter size: 19 Gauge Catheter at skin depth: 11 cm Test dose: negative  Assessment Events: blood not aspirated, injection not painful, no injection resistance, negative IV test and no paresthesia  Additional Notes Patient identified.  Risk benefits discussed including failed block, incomplete pain control, headache, nerve damage, paralysis, blood pressure changes, nausea, vomiting, reactions to medication both toxic or allergic, and postpartum back pain.  Patient expressed understanding and wished to proceed.  All questions were answered.  Sterile technique used throughout procedure and epidural site dressed with sterile barrier dressing. No paresthesia or other complications noted.The patient did not experience any signs of intravascular injection such as tinnitus or metallic taste in mouth nor signs of intrathecal spread such as rapid motor block. Please see nursing notes for vital signs.

## 2012-01-06 NOTE — Progress Notes (Signed)
Jade Potts is a 36 y.o. [phone] at [redacted]w[redacted]d admitted for IOL for gestational diabetes.  Subjective: Pain is controlled. Patient just received epidural.   Objective: BP 131/77  Pulse 74  Temp(Src) 97.5 F (36.4 C) (Oral)  Resp 18  Ht 5' (1.524 m)  Wt 174 lb (78.926 kg)  BMI 33.98 kg/m2  SpO2 99%      FHT:  FHR: 120 bpm, variability: moderate,  accelerations:  Present,  decelerations:  Absent UC:   regular, every 2-3 minutes SVE:   Dilation: 5 Effacement (%): 70 Station: -2;-3 Exam by:: [email or web address], rn Cervical exam: 6/80/-3; bloody show  Labs: Lab Results  Component Value Date   WBC 6.6 01/05/2012   HGB 10.7* 01/05/2012   HCT 34.5* 01/05/2012   MCV 79.1 01/05/2012   PLT 207 01/05/2012    Assessment / Plan: Induction of labor due to gestational diabetes,  progressing well on pitocin  Labor: progressing on pitocin Preeclampsia:  no signs or symptoms of toxicity Fetal Wellbeing:  Category I Pain Control:  Epidural I/D:  n/a Anticipated MOD:  SVD   OH PARK, ANGELA 01/06/2012, 5:13 PM

## 2012-01-06 NOTE — Progress Notes (Signed)
Jade Potts is a 36 y.o. [phone] at [redacted]w[redacted]d admitted for induction of labor for gestational diabetes.  Subjective: Patient complaining of significant pelvic pressure/pain.  Objective: BP 133/83  Pulse 82  Temp(Src) 97.5 F (36.4 C) (Oral)  Resp 18  Ht 5' (1.524 m)  Wt 174 lb (78.926 kg)  BMI 33.98 kg/m2  SpO2 98%      FHT:  FHR: 120 bpm, variability: moderate,  accelerations:  Present,  decelerations:  Absent UC:   regular, every 2-3 minutes SVE:   Dilation: 8 Effacement (%): 100 Station: -1 Exam by:: B.Smith RN SVE now: unchanged  Labs: Lab Results  Component Value Date   WBC 6.6 01/05/2012   HGB 10.7* 01/05/2012   HCT 34.5* 01/05/2012   MCV 79.1 01/05/2012   PLT 207 01/05/2012    Assessment / Plan: Augmentation of labor, progressing well  Labor: on pitocin; AROM performed now with bloody meconium (supervised by Dr. Adrian Blackwater) Preeclampsia:  no signs or symptoms of toxicity Fetal Wellbeing:  Category I Pain Control:  Epidural I/D:  n/a Anticipated MOD:  SVD   OH PARK, Tremane Spurgeon 01/06/2012, 7:27 PM

## 2012-01-06 NOTE — Progress Notes (Signed)
CYBELE MAULE is a 36 y.o. [phone] at [redacted]w[redacted]d by ultrasound admitted for induction of labor due to gestational diabetes.   Subjective:   Objective: BP 128/79  Pulse 84  Temp(Src) 98.2 F (36.8 C) (Oral)  Resp 18  Ht 5' (1.524 m)  Wt 174 lb (78.926 kg)  BMI 33.98 kg/m2     FHT:  FHR: 130 bpm, variability: moderate,  accelerations:  Present,  decelerations:  Absent UC:   regular, every 1-3 minutes SVE:   Dilation: 1 Effacement (%): 60 Station: -3 Exam by:: dr. Adrian Blackwater Exam now: 5/70/high  Labs: Lab Results  Component Value Date   WBC 6.6 01/05/2012   HGB 10.7* 01/05/2012   HCT 34.5* 01/05/2012   MCV 79.1 01/05/2012   PLT 207 01/05/2012    Assessment / Plan: Labor: Foley bulb now out; continue pitocin Preeclampsia:  no signs or symptoms of toxicity Fetal Wellbeing:  Category I Pain Control:  IV Nubain prn I/D:  n/a Anticipated MOD:  SVD   OH PARK, ANGELA 01/06/2012, 2:15 PM

## 2012-01-06 NOTE — Progress Notes (Signed)
Jade Potts is a 36 y.o. [phone] at [redacted]w[redacted]d by ultrasound admitted for induction of labor due to Gestational diabetes.  Subjective: Patient resting comfortably No complaints.   Objective: BP 126/71  Pulse 84  Temp(Src) 97.9 F (36.6 C) (Oral)  Resp 16  Ht 5' (1.524 m)  Wt 174 lb (78.926 kg)  BMI 33.98 kg/m2      FHT:  FHR: 140 bpm, variability: moderate,  accelerations:  Present,  decelerations:  Absent UC:   irregular, every >10 minutes SVE:   Dilation: 1 Effacement (%): 50 Station: Ballotable Exam by:: Lilli Few, RN  Labs: Lab Results  Component Value Date   WBC 6.6 01/05/2012   HGB 10.7* 01/05/2012   HCT 34.5* 01/05/2012   MCV 79.1 01/05/2012   PLT 207 01/05/2012    Assessment / Plan: Induction of labor due to gestational diabetes,  progressing well on pitocin  Labor: cytotec placed at 2200 yest, will reassess after 4hrs and either continue cytotec or start  pitocin Preeclampsia:  no SSX Fetal Wellbeing:  Category I Pain Control:  epidural prn I/D:  n/a Anticipated MOD:  NSVD  Cameron Proud 01/06/2012, 1:18 AM

## 2012-01-06 NOTE — Progress Notes (Signed)
Jade Potts is a 36 y.o. [phone] at [redacted]w[redacted]d by ultrasound admitted for induction of labor due to Gestational diabetes.  Subjective:   Objective: BP 137/84  Pulse 92  Temp(Src) 97.5 F (36.4 C) (Oral)  Resp 18  Ht 5' (1.524 m)  Wt 78.926 kg (174 lb)  BMI 33.98 kg/m2      FHT:  FHR: 120 bpm, variability: moderate,  accelerations:  Present,  decelerations:  Absent UC:   none SVE:   Dilation: 1 Effacement (%): 50 Station: -3;Ballotable Exam by:: Lilli Few, RN  Labs: Lab Results  Component Value Date   WBC 6.6 01/05/2012   HGB 10.7* 01/05/2012   HCT 34.5* 01/05/2012   MCV 79.1 01/05/2012   PLT 207 01/05/2012    Assessment / Plan: Induction of labor secondary to gestational diabetes  Labor: cervadil x3 placed (last at 0630), no cervical change yet, will reassess in 4hrs and consider foley bulb. Preeclampsia:  no SSx Fetal Wellbeing:  Category I Pain Control:  prn I/D:  n/a Anticipated MOD:  NSVD  Cameron Proud 01/06/2012, 7:10 AM

## 2012-01-06 NOTE — Progress Notes (Signed)
Jade Potts is a 36 y.o. [phone] at [redacted]w[redacted]d by ultrasound admitted for induction of labor due to gestational diabetes.   Subjective: Doing well. No complaints. Feels occasional contractions.                            Objective: BP 132/82  Pulse 86  Temp(Src) 97.3 F (36.3 C) (Oral)  Resp 18  Ht 5' (1.524 m)  Wt 174 lb (78.926 kg)  BMI 33.98 kg/m2     FHT:  FHR: 130 bpm, variability: moderate,  accelerations:  Present,  decelerations:  Absent UC:   regular, every 2-4 minutes SVE:   Dilation: 1 Effacement (%): 50 Station: -3;Ballotable Exam by:: Lilli Few, RN Re-checked by Dr. Adrian Blackwater. Unchanged cervical exam.  Labs: Lab Results  Component Value Date   WBC 6.6 01/05/2012   HGB 10.7* 01/05/2012   HCT 34.5* 01/05/2012   MCV 79.1 01/05/2012   PLT 207 01/05/2012    Assessment / Plan: Induction of labor for gestational diabetes.  Patient has received cytotec x 3.   Labor: will put in Foley bulb  Preeclampsia:  no signs or symptoms of toxicity Fetal Wellbeing:  Category II Pain Control:  epidural prn I/D:  n/a Anticipated MOD:  NSVD   OH PARK, ANGELA 01/06/2012, 10:40 AM

## 2012-01-06 NOTE — Anesthesia Preprocedure Evaluation (Signed)
Anesthesia Evaluation  Patient identified by MRN, date of birth, ID band Patient awake    Reviewed: Allergy & Precautions, H&P , Patient's Chart, lab work & pertinent test results  Airway Mallampati: III TM Distance: >3 FB Neck ROM: full    Dental No notable dental hx.    Pulmonary neg pulmonary ROS,  clear to auscultation  Pulmonary exam normal       Cardiovascular neg cardio ROS regular Normal    Neuro/Psych Negative Neurological ROS  Negative Psych ROS   GI/Hepatic negative GI ROS, Neg liver ROS,   Endo/Other  Negative Endocrine ROSDiabetes mellitus-  Renal/GU negative Renal ROS     Musculoskeletal   Abdominal   Peds  Hematology negative hematology ROS (+)   Anesthesia Other Findings   Reproductive/Obstetrics (+) Pregnancy                           Anesthesia Physical Anesthesia Plan  ASA: III  Anesthesia Plan: Epidural   Post-op Pain Management:    Induction:   Airway Management Planned:   Additional Equipment:   Intra-op Plan:   Post-operative Plan:   Informed Consent: I have reviewed the patients History and Physical, chart, labs and discussed the procedure including the risks, benefits and alternatives for the proposed anesthesia with the patient or authorized representative who has indicated his/her understanding and acceptance.     Plan Discussed with:   Anesthesia Plan Comments:         Anesthesia Quick Evaluation  

## 2012-01-07 ENCOUNTER — Encounter (HOSPITAL_COMMUNITY): Payer: Self-pay

## 2012-01-07 MED ORDER — SALINE SPRAY 0.65 % NA SOLN
1.0000 | NASAL | Status: DC | PRN
  Administered 2012-01-07: 1 via NASAL
  Filled 2012-01-07: qty 44

## 2012-01-07 NOTE — Progress Notes (Signed)
Post Partum Day 1 Subjective: no complaints, up ad lib, voiding, tolerating PO and + flatus  Objective: Blood pressure 118/80, pulse 80, temperature 97.7 F (36.5 C), temperature source Oral, resp. rate 20, height 5' (1.524 m), weight 78.926 kg (174 lb), SpO2 98.00%.  Physical Exam:  General: alert, cooperative and no distress Lochia: appropriate Uterine Fundus: firm DVT Evaluation: No evidence of DVT seen on physical exam. Negative Homan's sign.   Basename 01/05/12 2010  HGB 10.7*  HCT 34.5*    Assessment/Plan: Plan for discharge tomorrow, Breastfeeding and Contraception nexplanon   LOS: 2 days    D. Piloto [employer]. MD PGY-1 01/07/2012, 9:20 AM

## 2012-01-07 NOTE — Anesthesia Postprocedure Evaluation (Signed)
  Anesthesia Post-op Note  Patient: Jade Potts  Procedure(s) Performed: * No procedures listed *  Patient Location: Mother/Baby  Anesthesia Type: Epidural  Level of Consciousness: awake, alert  and oriented  Airway and Oxygen Therapy: Patient Spontanous Breathing  Post-op Pain: none  Post-op Assessment: Post-op Vital signs reviewed, Patient's Cardiovascular Status Stable, No headache, No backache, No residual numbness and No residual motor weakness  Post-op Vital Signs: Reviewed and stable  Complications: No apparent anesthesia complications

## 2012-01-07 NOTE — Progress Notes (Signed)
Called MD Stinson in regards to patient status change. Jade Potts  Has an episode of increased bleeding , moderate to large amount.  In which   the Rapid response OB RN to assist  With  observation of   her bleeding with palpation of the uterus two large clots where expelled and bleeding is now at  A  small loca.  Will continue to monitor bleeding .

## 2012-01-07 NOTE — Progress Notes (Signed)
UR chart review completed.  

## 2012-01-07 NOTE — Progress Notes (Signed)
[facility] Department of Clinical Social Work Documentation of Interpretation   I assisted ___________________ with interpretation of _stopped by to order breakfast_____________________ for this patient. 

## 2012-01-07 NOTE — Progress Notes (Signed)
NST 01/03/12 reviewed, reactive

## 2012-01-08 LAB — CBC
HCT: 24 % — ABNORMAL LOW (ref 36.0–46.0)
MCV: 80 fL (ref 78.0–100.0)
RBC: 3 MIL/uL — ABNORMAL LOW (ref 3.87–5.11)
RDW: 15.7 % — ABNORMAL HIGH (ref 11.5–15.5)
WBC: 7.7 10*3/uL (ref 4.0–10.5)

## 2012-01-08 MED ORDER — IBUPROFEN 600 MG PO TABS: 600.0000 mg | ORAL_TABLET | Freq: Four times a day (QID) | ORAL | Status: AC

## 2012-01-08 NOTE — Progress Notes (Signed)
Clarification of order, Called CNW Clinton Sawyer  for clarification, new order was given to discontinue  CBG in AM, will continue to monitor.

## 2012-01-08 NOTE — Discharge Summary (Signed)
Obstetric Discharge Summary Reason for Admission: onset of labor Prenatal Procedures: ultrasound Intrapartum Procedures: spontaneous vaginal delivery Postpartum Procedures: none Complications-Operative and Postpartum: none Hemoglobin  Date Value Range Status  01/08/2012 7.4* 12.0-15.0 (g/dL) Final     HCT  Date Value Range Status  01/08/2012 24.0* 36.0-46.0 (%) Final    Discharge Diagnoses: Term Pregnancy-delivered  Discharge Information: Date: 01/08/2012 Activity: unrestricted Diet: routine Medications: Ibuprofen Condition: stable Instructions: refer to practice specific booklet Discharge to: home Follow-up Information    Follow up with [facility]. (cita en 6 semanas.)    Contact information:   [address] [place] [address] [phone]         Newborn Data: Live born female  Birth Weight: 6 lb 11.6 oz (3050 g) APGAR: 8, 9  Home with mother.  Jade Potts, Jade Potts 01/08/2012, 7:59 AM

## 2012-01-15 NOTE — Telephone Encounter (Signed)
Preadmission screen  

## 2012-01-16 NOTE — Progress Notes (Signed)
NST reactive on 12/27/11

## 2012-01-16 NOTE — Progress Notes (Signed)
NST from 12/20/11  Baseline settled into 150.  Reactive and reassuring

## 2012-01-16 NOTE — Progress Notes (Signed)
NST 12/17/11 Reactive and Reassuring 

## 2012-02-07 ENCOUNTER — Ambulatory Visit (INDEPENDENT_AMBULATORY_CARE_PROVIDER_SITE_OTHER): Payer: Self-pay | Admitting: Physician Assistant

## 2012-02-07 ENCOUNTER — Encounter: Payer: Self-pay | Admitting: Physician Assistant

## 2012-02-07 NOTE — Patient Instructions (Signed)
Etonogestrel implant Qu es este medicamento? El ETONOGESTREL es un dispositivo anticonceptivo (control de la natalidad). Se utiliza para prevenir el embarazo. Se puede utilizar hasta 3 aos. Este medicamento puede ser utilizado para otros usos; si tiene alguna pregunta consulte con su proveedor de atencin mdica o con su farmacutico. Qu le debo informar a mi profesional de la salud antes de tomar este medicamento? Necesita saber si usted presenta alguno de los siguientes problemas o situaciones: -sangrado vaginal anormal -enfermedad vascular o cogulos sanguneos -cncer de mama, cervical, heptico -depresin -diabetes -enfermedad de la vescula biliar -dolores de cabeza -enfermedad cardiaca o ataque cardiaco reciente -alta presin sangunea -alto nivel de colesterol -enfermedad renal -enfermedad heptica -convulsiones -fuma tabaco -una reaccin alrgica o inusual al etonogestrel, otras hormonas, anestsicos o antispticos, medicamentos, alimentos, colorantes o conservantes -si est embarazada o buscando quedar embarazada -si est amamantando a un beb Cmo debo utilizar este medicamento? Este dispositivo se inserta debajo de la piel en la cara interna de la parte superior del brazo por un profesional de la salud. Hable con su pediatra para informarse acerca del uso de este medicamento en nios. Puede requerir atencin especial. Sobredosis: Pngase en contacto inmediatamente con un centro toxicolgico o una sala de urgencia si usted cree que haya tomado demasiado medicamento. ATENCIN: Este medicamento es solo para usted. No comparta este medicamento con nadie. Qu sucede si me olvido de una dosis? No se aplica en este caso. Qu puede interactuar con este medicamento? No tome esta medicina con ninguno de los siguientes medicamentos: -amprenavir -bosentano -fosamprenavir  Esta medicina tambin puede interactuar con los siguientes medicamentos: -medicamentos barbitricos  para inducir el sueo o tratar convulsiones -ciertos medicamentos para las infecciones micticas tales como quetoconazol e itraconazol -griseofulvina -medicamentos para tratar convulsiones, tales como carbamazepina, felbamato, oxcarbazepina, fenitona, topiramato -modafinil -fenilbutazona -rifampicina -algunos medicamentos para tratar la infeccin por VIH tales como atazanavir, indinavir, lopinavir, nelfinavir, tipranavir, ritonavir -hierba de [place] Puede ser que esta lista no menciona todas las posibles interacciones. Informe a su profesional de la salud de todos los productos a base de hierbas, medicamentos de venta libre o suplementos nutritivos que est tomando. Si usted fuma, consume bebidas alcohlicas o si utiliza drogas ilegales, indqueselo tambin a su profesional de la salud. Algunas sustancias pueden interactuar con su medicamento. A qu debo estar atento al usar este medicamento? Este medicamento no protege contra la infeccin por el VIH (SIDA) o otras enfermedades de transmisin sexual. Usted debe sentir el implante al presionar con las yemas de los dedos sobre la piel donde se insert. Dgale a su mdico si no se siente el implante. Qu efectos secundarios puedo tener al utilizar este medicamento? Efectos secundarios que debe informar a su mdico o a su profesional de la salud tan pronto como sea posible: -reacciones alrgicas como erupcin cutnea, picazn o urticarias, hinchazn de la cara, labios o lengua -ndulos mamarios -cambios en la visin -confusin, dificultad para hablar o entender -orina de color oscura -humor deprimido -sensacin general de estar enfermo o sntomas gripales -heces claras -prdida del apetito, nuseas -dolor en la regin abdominal superior derecha -dolores de cabeza severos -dolor, hinchazon o sensibilidad grave en el abdomen -falta de aliento, dolor en el pecho, hinchazn de la pierna -seales de un embarazo -entumecimiento o debilidad  repentina de la cara, brazo o pierna -dificultad para caminar, mareos, prdida de equilibrio o coordinacin -sangrado o flujo vaginal inusual -cansancio o debilidad inusual -color amarillento de los ojos o la piel  Efectos secundarios que,   por lo general, no requieren atencin mdica (debe informarlos a su mdico o a su profesional de la salud si persisten o si son molestos): -acn -dolor de pecho -cambios de peso -tos -fiebre o escalofros -dolor de cabeza -sangrado menstrual irregular -picazn, ardo o flujo vaginal -dolor o dificultad para orinar -dolor de garganta Puede ser que esta lista no menciona todos los posibles efectos secundarios. Comunquese a su mdico por asesoramiento mdico sobre los efectos secundarios. Usted puede informar los efectos secundarios a la FDA por telfono al 1-800-FDA-1088. Dnde debo guardar mi medicina? Este medicamento se administra en hospitales o clnicas y no necesitar guardarlo en su domicilio. ATENCIN: Este folleto es un resumen. Puede ser que no cubra toda la posible informacin. Si usted tiene preguntas acerca de esta medicina, consulte con su mdico, su farmacutico o su profesional de la salud.  2012, Elsevier/Gold Standard. (02/22/2010 1:35:56 PM) 

## 2012-02-07 NOTE — Progress Notes (Signed)
  Subjective:     Jade Potts is a 36 y.o. female who presents for a postpartum visit. She is 5 weeks postpartum following a spontaneous vaginal delivery. I have fully reviewed the prenatal and intrapartum course. The delivery was at 39 gestational weeks after an IOL for GDM. Outcome: spontaneous vaginal delivery. Anesthesia: epidural. Postpartum course has been uncomplicated. Baby's course has been uncomplicated. Baby is feeding by bottle. Bleeding no bleeding and stopped 1 week ago. Bowel function is normal. Bladder function is normal. Patient is not sexually active. Contraception method is none and desires Nexplanon. Postpartum depression screening: negative.  The following portions of the patient's history were reviewed and updated as appropriate: allergies, current medications, past family history, past medical history, past social history, past surgical history and problem list.  Review of Systems A comprehensive review of systems was negative.   Objective:    BP 127/79  Pulse 80  Temp(Src) 97 F (36.1 C) (Oral)  Ht 5' (1.524 m)  Wt 155 lb 11.2 oz (70.625 kg)  BMI 30.41 kg/m2  Breastfeeding? No  General:  alert, cooperative and no distress   Breasts:  inspection negative, no nipple discharge or bleeding, no masses or nodularity palpable  Abdomen: soft, non-tender; bowel sounds normal; no masses,  no organomegaly and Grade I diastasis        Assessment:    5 postpartum exam. Pap smear not done at today's visit.   Plan:    1. Contraception: Nexplanon to be inserted, reviewed barrier method or abstinence in meantime 2.  Follow up Monday for 2 hour glucola and next available appt for Nexplanon 3. Pap and Annual exam due 6 months 4. Resume normal activities. Exercise encouraged. Daily multivitamin/PNV recommended.

## 2012-02-11 ENCOUNTER — Other Ambulatory Visit: Payer: Self-pay | Admitting: Obstetrics & Gynecology

## 2012-02-11 ENCOUNTER — Other Ambulatory Visit: Payer: Self-pay

## 2012-02-11 DIAGNOSIS — O24419 Gestational diabetes mellitus in pregnancy, unspecified control: Secondary | ICD-10-CM

## 2012-02-12 LAB — GLUCOSE TOLERANCE, 2 HOURS W/ 1HR
Glucose, 1 hour: 119 mg/dL (ref 70–170)
Glucose, Fasting: 98 mg/dL (ref 70–99)

## 2012-02-21 ENCOUNTER — Ambulatory Visit (INDEPENDENT_AMBULATORY_CARE_PROVIDER_SITE_OTHER): Payer: Medicaid Other | Admitting: Physician Assistant

## 2012-02-21 ENCOUNTER — Encounter: Payer: Self-pay | Admitting: Physician Assistant

## 2012-02-21 VITALS — BP 138/67 | HR 68 | Temp 97.7°F | Ht 60.25 in | Wt 157.8 lb

## 2012-02-21 DIAGNOSIS — Z30017 Encounter for initial prescription of implantable subdermal contraceptive: Secondary | ICD-10-CM

## 2012-02-21 DIAGNOSIS — Z3049 Encounter for surveillance of other contraceptives: Secondary | ICD-10-CM

## 2012-02-21 DIAGNOSIS — Z01812 Encounter for preprocedural laboratory examination: Secondary | ICD-10-CM

## 2012-02-21 HISTORY — DX: Encounter for initial prescription of implantable subdermal contraceptive: Z30.017

## 2012-02-21 LAB — POCT PREGNANCY, URINE: Preg Test, Ur: NEGATIVE

## 2012-02-21 NOTE — Patient Instructions (Signed)
Informacin sobre anticonceptivos implantables  (Contraceptive Implant Information) Un implante anticonceptivo es una varilla plstica que se inserta bajo la piel. Generalmente se coloca debajo de la piel del brazo. Libera continuamente pequeas cantidades de progestina (progesterona sinttica)en el torrente sanguneo. Esto impide que el vulo sea liberado del ovario. Tambin espesa el moco cervical para impedir que los espermatozoides entren en el cuello del tero y hace que el revestimiento del tero se adelgace para evitar que un vulo fertilizado se adhiera al tero. Pueden ser efectivos durante un mximo de 3 aos. No protegen contra las enfermedades de transmisin sexual (ETS).  El procedimiento para insertar un implante generalmente demora alrededor de 10 minutos. Puede haber ligeras molestias, hematoma o hinchazn en el sitio de la insercin durante un par [employer]. El implante comienza a funcionar dentro del Social worker. Durante 2 semanas se debe utilizar otra proteccin anticonceptiva. Haga un seguimiento con su mdico para volver a controlarse, segn las indicaciones.  Su mdico se asegurar de que usted es una buena candidata para el anticonceptivo implantable. Converse con su mdico acerca de los posibles efectos secundarios del implante.  VENTAJAS   Evita el embarazo durante un mximo de 3 aos.   Es fcilmente reversible.   Es conveniente.   La progestina protege contra el cncer de tero y de ovario.   Se puede utilizar [employer] merchand[employer].   Puede ser utilizado por mujeres que no pueden tomar [employer].  DESVENTAJAS   Es posible que tenga un sangrado vaginal irregular o imprevisto.   Puede sufrir efectos secundarios como aumento de [place], [place], mining de [place], [place], sensibilidad en las mamas o cambios de humor.   Es posible que sufra un dao en los tejidos o en los nervios despus de la insercin (raro).   Puede ser difcil e incmodo de retirar.   Algunos medicamentos pueden  interferir con la eficacia de los implantes.  EXTRACCIN DEL IMPLANTE  El implante debe ser removido a los 3 aos o segn las indicaciones de su mdico. Su efecto desaparece en algunas horas despus de la extraccin. La capacidad de quedar embarazada (fertilidad) se restablece en un par de semanas. Si lo desea, podr colocarse un nuevo implante tan pronto como se retire el viejo.  NO SE COLOQUE EL IMPLANTE SI:   Est embarazada.   Tiene una historia clnica de cncer de mama, osteoporosis, cogulos sanguneos, enfermedades cardacas, diabetes, hipertensin arterial, enfermedades hepticas, tumores o ictus.    Tiene una hemorragia vaginal sin diagnosticar.   Es hipersensible a ciertas partes del implante.  Document Released: 11/15/2011 [facility] Patient Information 2012 [place], [place].

## 2012-02-21 NOTE — Progress Notes (Signed)
Patient given informed consent, signed copy in the chart, time out was performed. Pregnancy test was negative.  Appropriate time out taken.  Patient's left arm was prepped and draped in the usual sterile fashion.. The ruler used to measure and mark insertion area.  Pt was prepped with alcohol swab and then injected with 3 cc of 1% lidocaine without epinephrine.  Pt was prepped with betadine, Nexplanon removed form packaging,  Device confirmed in needle, then inserted full length of needle and withdrawn per handbook instructions.  Pt insertion site covered with pressure bandage.   Minimal blood loss.  Pt tolerated the procedure well.    Hebah Bogosian E. 02/21/2012 2:24 PM

## 2012-02-22 MED ORDER — ETONOGESTREL 68 MG ~~LOC~~ IMPL
68.0000 mg | DRUG_IMPLANT | Freq: Once | SUBCUTANEOUS | Status: AC
  Administered 2012-02-22: 68 mg via SUBCUTANEOUS

## 2012-02-22 NOTE — Progress Notes (Signed)
Addended by: Lynnell Dike on: 02/22/2012 12:42 PM   Modules accepted: Orders

## 2013-03-04 IMAGING — US US OB FOLLOW-UP
1 series · 12 of 28 positions shown · non-contrast
Comparison: none

[Series 1: us ob follow up · 34 acquisitions, 12 frames shown]
[im 2/34]
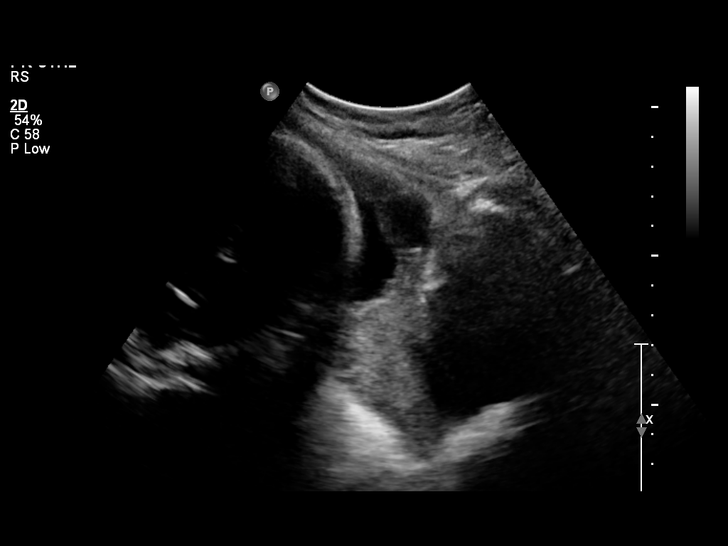
[im 4/34]
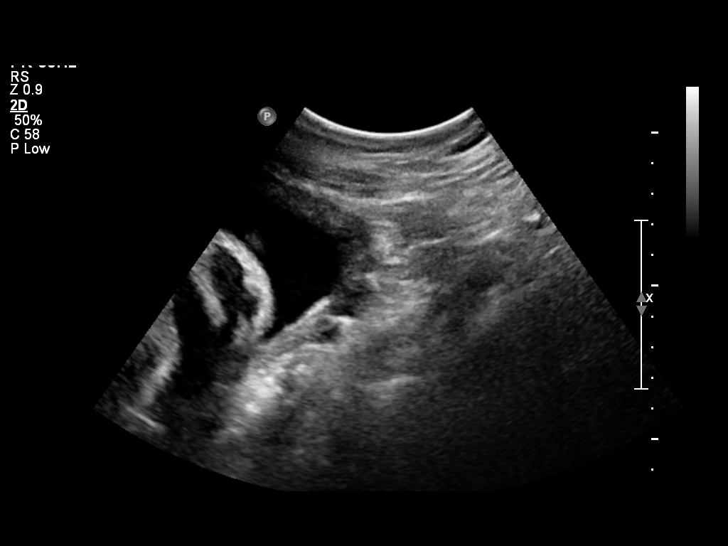
[im 7/34]
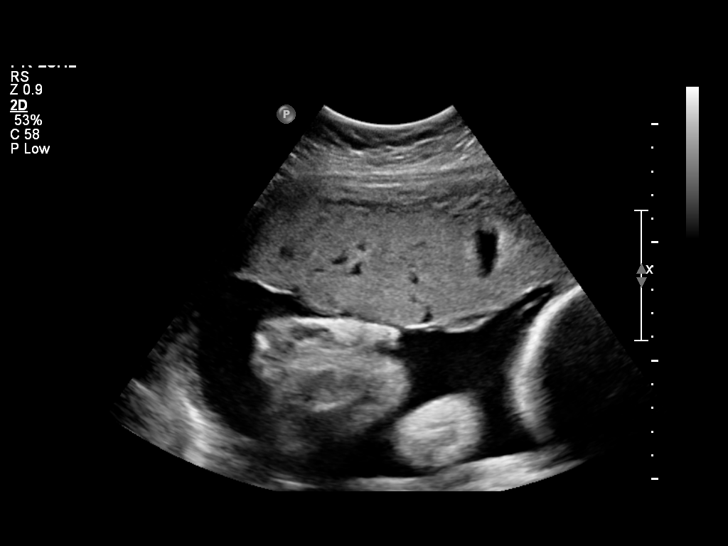
[im 10/34]
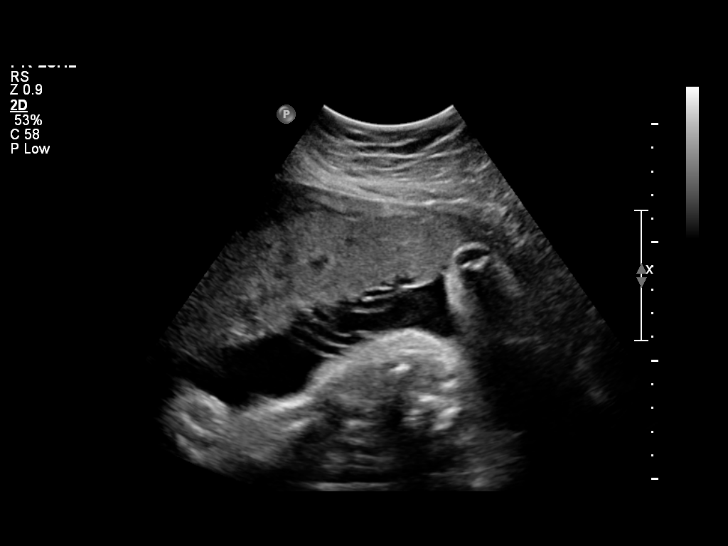
[im 13/34]
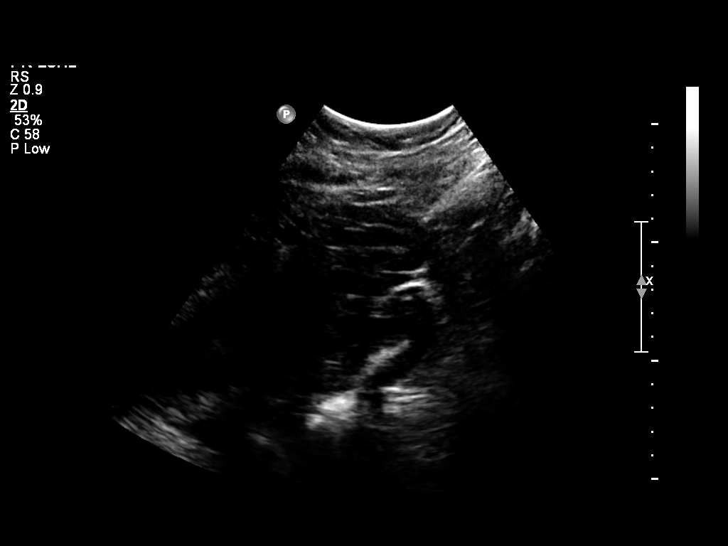
[im 15/34]
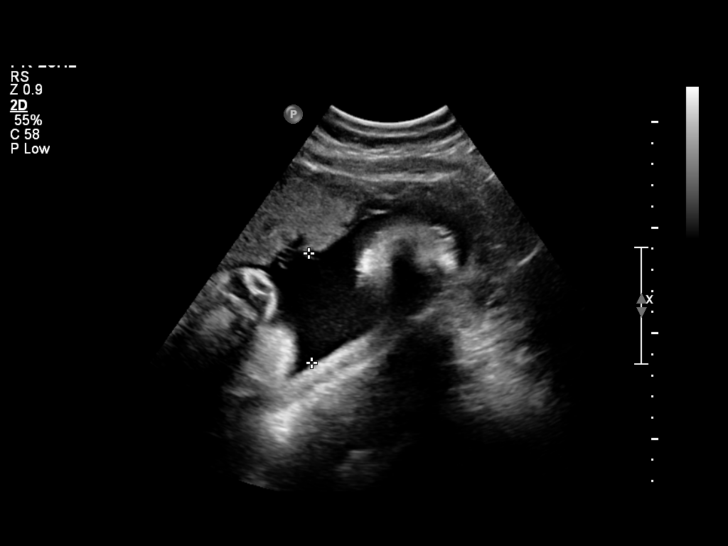
[im 19/34]
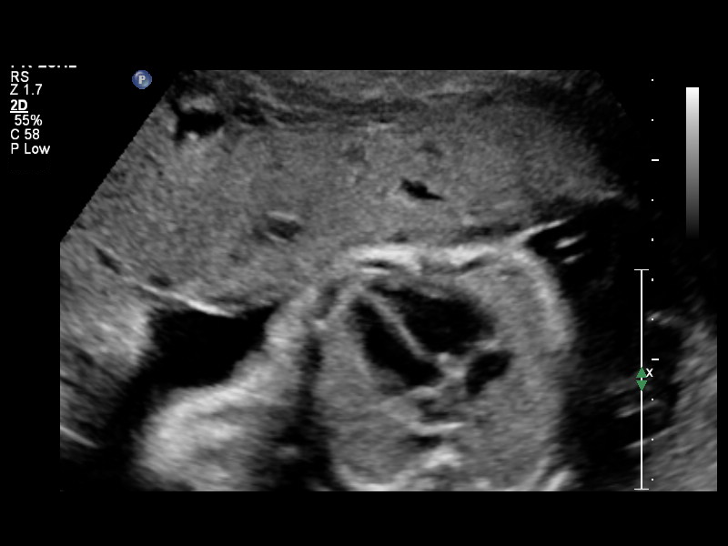
[im 21/34]
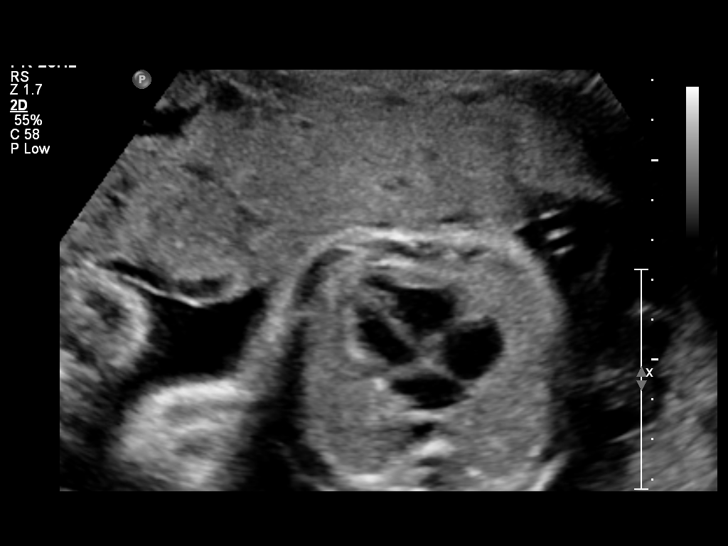
[im 24/34]
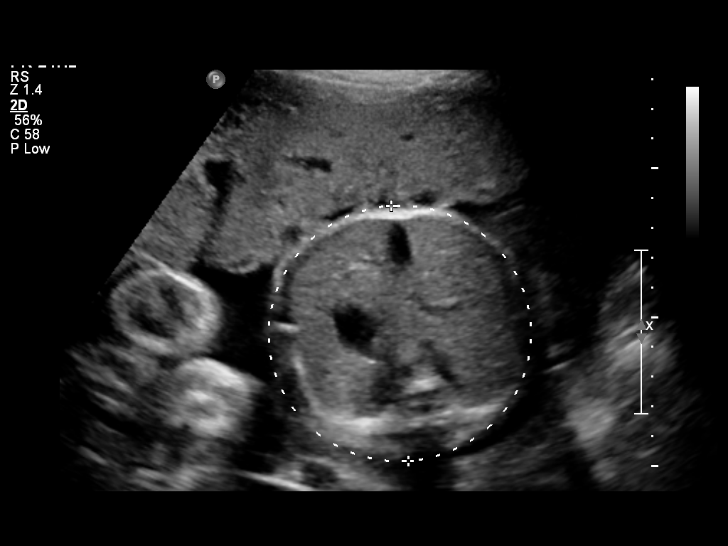
[im 27/34]
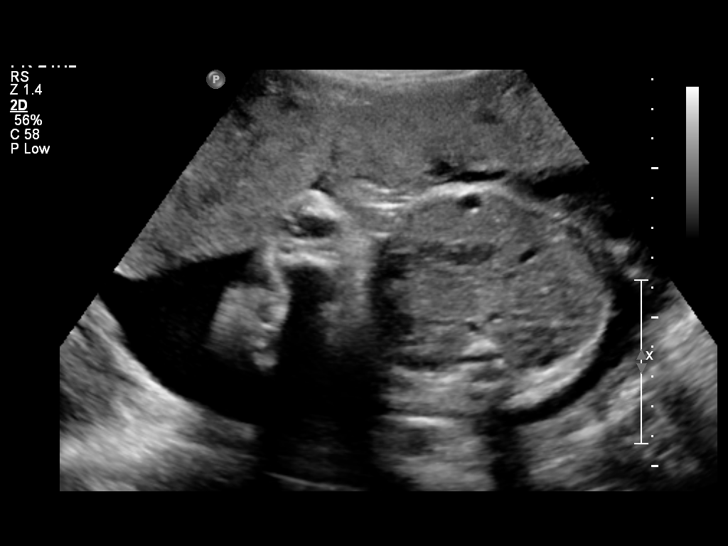
[im 30/34]
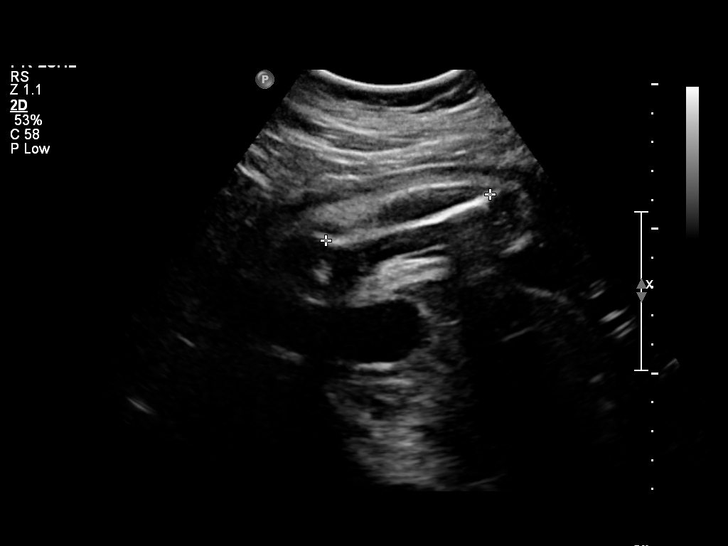
[im 32/34]
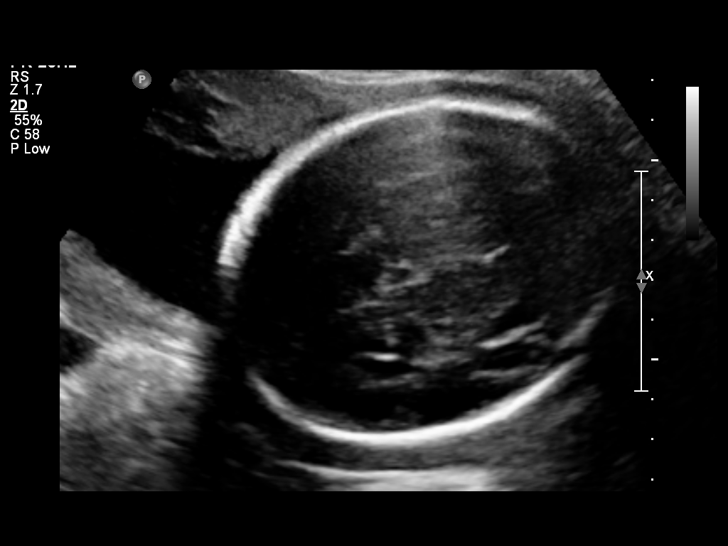

[12 of 28 positions shown; findings below may reference images not displayed]

OBSTETRICS REPORT
                      (Signed Final 11/08/2011 [DATE])

                 [facility]-
                 Faculty Physician
 Order#:         [id number]
Procedures

 US OB FOLLOW UP                                       76816.1
Indications

 Marginal Placental Cord Insertion
 Assess Fetal Growth / Estimated Fetal Weight
 Diabetes - Gestational
 Anemia
Fetal Evaluation

 Fetal Heart Rate:  136                         bpm
 Cardiac Activity:  Observed
 Presentation:      Cephalic
 Placenta:          Anterior, above cervical os
 P. Cord            Visualized
 Insertion:

 Comment:    Umbilical cord inserts 5cm from placental edge.  No
             subchorionic hemorrhage seen.

 Amniotic Fluid
 AFI FV:      Subjectively within normal limits
 AFI Sum:     17.53   cm      65   %Tile     Larg Pckt:   5.55   cm
 RUQ:   5.55   cm    RLQ:    4.53   cm    LUQ:   2.28    cm   LLQ:    5.17   cm
Biometry

 BPD:     79.9  mm    G. Age:   32w 0d                CI:         74.2   70 - 86
                                                      FL/HC:      19.7   19.3 -

 HC:     294.5  mm    G. Age:   32w 4d       65  %    HC/AC:      1.11   0.96 -

 AC:     265.7  mm    G. Age:   30w 5d       46  %    FL/BPD:     72.5   71 - 87
 FL:      57.9  mm    G. Age:   30w 2d       24  %    FL/AC:      21.8   20 - 24

 Est. FW:    7147  gm    3 lb 10 oz      57  %
Gestational Age

 LMP:           43w 0d       Date:   01/11/11                 EDD:   10/18/11
 U/S Today:     31w 3d                                        EDD:   01/07/12
 Best:          30w 5d    Det. By:   U/S C R L (06/29/11)     EDD:   01/12/12
Anatomy

 Cranium:           Appears normal      Aortic Arch:       Previously seen
 Fetal Cavum:       Appears normal      Ductal Arch:       Previously seen
 Ventricles:        Appears normal      Diaphragm:         Previously seen
 Choroid Plexus:    Previously seen     Stomach:           Appears normal
 Cerebellum:        Previously seen     Abdomen:           Appears normal
 Posterior Fossa:   Previously seen     Abdominal Wall:    Previously seen
 Nuchal Fold:       Previously seen     Cord Vessels:      Previously seen
 Face:              Previously seen     Kidneys:           Appear normal
 Heart:             Appears normal      Bladder:           Appears normal
                    (4 chamber &
                    axis)
 RVOT:              Previously seen     Spine:             Previously seen
 LVOT:              Previously seen     Limbs:             Previously seen

 Other:     Female gender,Heels visualized,5th digit previously
            visualized.
Cervix Uterus Adnexa

 Cervical Length:   3.7       cm

 Cervix:       Closed. Normal appearance by transabdominal scan.

 Adnexa:     No abnormality visualized.
Impression

  Single living intrauterine gestation in cephalic presentation
 with concordant gestational age and fetal indices.  The EFW
 today is at the  57th  percentile.  Normal amniotic fluid
 volume. Normal cord insertion into placenta.

 questions or concerns.

## 2013-10-29 ENCOUNTER — Emergency Department (INDEPENDENT_AMBULATORY_CARE_PROVIDER_SITE_OTHER)
Admission: EM | Admit: 2013-10-29 | Discharge: 2013-10-29 | Disposition: A | Discharge status: Home / Self Care | Payer: PRIVATE HEALTH INSURANCE | Source: Home / Self Care | Attending: Family Medicine | Admitting: Family Medicine

## 2013-10-29 ENCOUNTER — Encounter (HOSPITAL_COMMUNITY): Payer: Self-pay | Admitting: Emergency Medicine

## 2013-10-29 DIAGNOSIS — L600 Ingrowing nail: Secondary | ICD-10-CM

## 2013-10-29 MED ORDER — SULFAMETHOXAZOLE-TRIMETHOPRIM 800-160 MG PO TABS: 1.0000 | ORAL_TABLET | Freq: Two times a day (BID) | ORAL | Status: DC

## 2013-10-29 MED ORDER — HYDROCODONE-ACETAMINOPHEN 5-325 MG PO TABS: 1.0000 | ORAL_TABLET | Freq: Four times a day (QID) | ORAL | Status: DC | PRN

## 2013-10-29 NOTE — ED Notes (Signed)
Pt c/o left ingrown toe nail of greater toe onset 1 month Sxs include: swelling and tender Alert w/no signs of acute distress... Ambulated w/steady Gait

## 2013-10-29 NOTE — ED Provider Notes (Signed)
Jade Potts is a 37 y.o. female who presents to Urgent Care today for left great toe lateral ingrown toenail. Present for about one month slightly worsening. She notes redness pain and tenderness. She denies any radiating pain or weakness. She has tried some over-the-counter pain medications which have not been helpful. She feels well otherwise. She denies pregnancy. She is not breast-feeding.   Past Medical History  Diagnosis Date  . No pertinent past medical history   . Gestational diabetes     1st and 2nd pregnancy  . Anemia     1st pregnancy  . Insertion of Nexplanon 02/21/2012    02/21/2012   History  Substance Use Topics  . Smoking status: Never Smoker   . Smokeless tobacco: Never Used  . Alcohol Use: No   ROS as above Medications reviewed. No current facility-administered medications for this encounter.   Current Outpatient Prescriptions  Medication Sig Dispense Refill  . acetaminophen (TYLENOL) 325 MG tablet Take 650 mg by mouth every 6 (six) hours as needed. Head ache      . HYDROcodone-acetaminophen (NORCO/VICODIN) 5-325 MG per tablet Take 1 tablet by mouth every 6 (six) hours as needed. spanish  15 tablet  0  . prenatal vitamin w/FE, FA (PRENATAL 1 + 1) 27-1 MG TABS Take 1 tablet by mouth daily.        Marland Kitchen sulfamethoxazole-trimethoprim (SEPTRA DS) 800-160 MG per tablet Take 1 tablet by mouth 2 (two) times daily. spanish  28 tablet  0    Exam:  BP 138/88  Pulse 72  Temp(Src) 98.1 F (36.7 C) (Oral)  SpO2 96%  Breastfeeding? No Gen: Well NAD LEFT GREAT TOENAIL: Ingrown on the lateral border. Mildly erythematous and tender to touch. Great capillary refill and sensation.   Procedure note: Partial Toenail removal Consent obtained and timeout performed.  Alcohol use to clean the toe and 5 mL of 2% lidocaine without epinephrine were injected in a medial and dorsal block and under the nail plate .  Then after 3 minutes a rubber band tourniquet was applied and the toe was  cleaned with Betadine.  A nail elevator was used to elevate the lateral nail, and scissors were used to cut the nail approximately 5 mm from the medial edge down to the base of the nail.  Then a scalpel was used to sharply divided the remaining nail.  Then forceps were used to grasp this now free medial piece of nail in the entire nail medial nail sliver was removed.  A curette was used to destroy the matrix, and the tourniquet was removed.  Total tourniquet time was 2 minutes.  A dressing was then applied, and checked that there was no significant bleeding after 5 minutes.   Patient tolerated the procedure well.    No results found for this or any previous visit (from the past 24 hour(s)). No results found.  Assessment and Plan: 37 y.o. female with left ingrown toenail. Status post partial toenail removal. Plan to use after antibiotics as well. We'll use Norco for pain control. Followup in 10 days. Instructions provided in Spanish.  Discussed warning signs or symptoms. Please see discharge instructions. Patient expresses understanding.      Rodolph Bong, MD 10/29/13 [phone]

## 2014-03-22 ENCOUNTER — Emergency Department (HOSPITAL_COMMUNITY)
Admission: EM | Admit: 2014-03-22 | Discharge: 2014-03-22 | Disposition: A | Discharge status: Home / Self Care | Payer: No Typology Code available for payment source | Source: Home / Self Care | Attending: Emergency Medicine | Admitting: Emergency Medicine

## 2014-03-22 ENCOUNTER — Encounter (HOSPITAL_COMMUNITY): Payer: Self-pay | Admitting: Emergency Medicine

## 2014-03-22 DIAGNOSIS — L600 Ingrowing nail: Secondary | ICD-10-CM

## 2014-03-22 MED ORDER — SULFAMETHOXAZOLE-TMP DS 800-160 MG PO TABS: 2.0000 | ORAL_TABLET | Freq: Two times a day (BID) | ORAL | Status: DC

## 2014-03-22 MED ORDER — DOXYCYCLINE HYCLATE 100 MG PO TABS: 100.0000 mg | ORAL_TABLET | Freq: Two times a day (BID) | ORAL | Status: DC

## 2014-03-22 MED ORDER — HYDROCODONE-ACETAMINOPHEN 5-325 MG PO TABS: ORAL_TABLET | ORAL | Status: DC

## 2014-03-22 NOTE — Discharge Instructions (Signed)
Infección en la uña del pie encarnada °(Infected Ingrown Toenail) °La uña encarnada se produce cuando el borde de la uña crece dentro de la piel y las bacterias invaden el área. Los síntomas son dolor, sensibilidad, hinchazón y drenaje de pus en el borde de la uña. Zapatos que no ajustan bien, lesiones menores y cortar las uñas de modo incorrecto también contribuyen al problema. Debe cortar las uñas de forma cuadrada en vez de redondear los bordes. No las corte demasiado. Evite los zapatos ajustados o con punta. En algunos casos la zona encarnada de la uña debe extirparse. Si le retiran la uña, puede demorar entre 3 y 4 meses hasta que vuelva a crecer. °INSTRUCCIONES PARA EL CUIDADO DOMICILIARIO °· Remoje el dedo infectado en agua tibia durante 20 a 30 minutos, 3 a 4 veces por día. °· Las compresas o vendajes deben cambiarse diariamente. °· Tome todos los medicamentos según las indicaciones y finalícelos. °· Disminuya las actividades y mantenga el pie elevado siempre que pueda, para reducir la hinchazón y las molestias. Haga esto hasta que la infección mejore. °· Use sandalias o camine descalzo siempre que pueda mientras la zona infectada esté sensible. °· Concurra a una visita de seguimiento con el profesional luego de 2 ó 3 días, si la infección no mejora. °SOLICITE ATENCIÓN MÉDICA SI: °El dedo se vuelve rojo, se hincha o duele. °ESTÉ SEGURO QUE:  °· Comprende las instrucciones para el alta médica. °· Controlará su enfermedad. °· Solicitará atención médica de inmediato según las indicaciones. °Document Released: 09/12/2006 Document Revised: 02/18/2012 °ExitCare® Patient Information ©2014 [employer]. ° °

## 2014-03-22 NOTE — ED Notes (Signed)
Left great toe pain, ingrown nail.

## 2014-03-22 NOTE — ED Provider Notes (Addendum)
Chief Complaint   Chief Complaint  Patient presents with  . Foot Pain    History of Present Illness   Jade BilletMaria G EscalanteFacundo is a 38 year old female who has a painful, ingrown toenail of the medial nail fold of the left great toe. This is been going on for several months, but she's just been putting up with it. It has not drained any pus. She denies any redness or fever. It hurts to walk with any pressure in the area. She's been taking Tylenol for the pain.  Review of Systems   Other than as noted above, the patient denies any of the following symptoms: Systemic:  No fevers or chills. Musculoskeletal:  No joint pain or arthritis.  Neurological:  No muscular weakness, paresthesias.   PMFSH   Past medical history, family history, social history, meds, and allergies were reviewed.     Physical  Examination     Vital signs:  BP 135/89  Pulse 60  Temp(Src) 98.5 F (36.9 C) (Oral)  Resp 14  SpO2 100% Gen:  Alert and oriented times 3.  In no distress. Musculoskeletal:  Exam of the foot reveals the medial nail fold the left great toe was ingrown and tender to palpation. There wasn't much inflammation present. No visible collection of pus.  Otherwise, all joints had a full a ROM with no swelling, bruising or deformity.  No edema, pulses full. Extremities were warm and pink.  Capillary refill was brisk.  Skin:  Clear, warm and dry.  No rash. Neuro:  Alert and oriented times 3.  Muscle strength was normal.  Sensation was intact to light touch.   Procedure Note:  Verbal informed consent was obtained from the patient.  Risks and benefits were outlined with the patient.  Patient understands and accepts these risks. A time out was called and the procedure and identity of the patient were confirmed verbally.    The procedure was then performed as follows:  The toe was prepped with Betadine and alcohol and anesthetized with a digital block with 5 ML's of 2% Xylocaine without epinephrine.  After satisfactory local anesthesia was achieved, the medial aspect of the nail was elevated with a nail elevator, and the medialmost aspect of the nail was snipped off to about 5 cm. This was carried down to the and the nail and the ingrown portion of the nail was avulsed. There was minimal bleeding present and no purulent drainage. Antibiotic ointment was applied and a sterile dressing. She was given a postoperative shoe was instructed in wound care.   The patient tolerated the procedure well without any immediate complications.   Assessment   The encounter diagnosis was Ingrown toenail.  Plan    1.  Meds:  The following meds were prescribed:   Discharge Medication List as of 03/22/2014 12:14 PM    START taking these medications   Details  doxycycline (VIBRA-TABS) 100 MG tablet Take 1 tablet (100 mg total) by mouth 2 (two) times daily., Starting 03/22/2014, Until Discontinued, Print    !! HYDROcodone-acetaminophen (NORCO/VICODIN) 5-325 MG per tablet 1 to 2 tabs every 4 to 6 hours as needed for pain., Print     !! - Potential duplicate medications found. Please discuss with provider.      2.  Patient Education/Counseling:  The patient was given appropriate handouts, self care instructions, and instructed in symptomatic relief including soaking in warm water twice daily followed by application of antibiotic ointment and a Band-Aid dressing.    3.  Follow up:  The patient was told to follow up here if no better in 3 to 4 days, or sooner if becoming worse in any way, and given some red flag symptoms such as worsening pain or any evidence of infection which would prompt immediate return.        Reuben Likesavid C Maressa Apollo, MD 03/22/14 1256  Addendum: The patient calls back today, stating that the doxycycline was too expensive. I sent a prescription in to [facility] [address] for Septra DS, #40, 2 twice a day for 10 days.  Reuben Likesavid C Verenice Westrich, MD 03/22/14 [phone]

## 2014-10-11 ENCOUNTER — Encounter (HOSPITAL_COMMUNITY): Payer: Self-pay | Admitting: Emergency Medicine

## 2015-03-02 ENCOUNTER — Ambulatory Visit: Payer: Self-pay | Attending: Obstetrics and Gynecology

## 2015-11-18 LAB — POCT GLUCOSE (2 HR PP): GLUCOSE 2 HR PP, POC: 90 mg/dL

## 2016-03-30 ENCOUNTER — Encounter (HOSPITAL_COMMUNITY): Payer: Self-pay | Admitting: Emergency Medicine

## 2016-03-30 ENCOUNTER — Ambulatory Visit (HOSPITAL_COMMUNITY)
Admission: EM | Admit: 2016-03-30 | Discharge: 2016-03-30 | Disposition: A | Discharge status: Home / Self Care | Payer: Self-pay | Attending: Emergency Medicine | Admitting: Emergency Medicine

## 2016-03-30 DIAGNOSIS — T887XXA Unspecified adverse effect of drug or medicament, initial encounter: Secondary | ICD-10-CM

## 2016-03-30 DIAGNOSIS — R55 Syncope and collapse: Secondary | ICD-10-CM

## 2016-03-30 DIAGNOSIS — T50905A Adverse effect of unspecified drugs, medicaments and biological substances, initial encounter: Secondary | ICD-10-CM

## 2016-03-30 LAB — POCT I-STAT, CHEM 8
BUN: 9 mg/dL (ref 6–20)
CHLORIDE: 103 mmol/L (ref 101–111)
CREATININE: 0.6 mg/dL (ref 0.44–1.00)
Calcium, Ion: 1.2 mmol/L (ref 1.12–1.23)
Glucose, Bld: 145 mg/dL — ABNORMAL HIGH (ref 65–99)
HEMATOCRIT: 48 % — AB (ref 36.0–46.0)
Hemoglobin: 16.3 g/dL — ABNORMAL HIGH (ref 12.0–15.0)
Potassium: 4.1 mmol/L (ref 3.5–5.1)
SODIUM: 142 mmol/L (ref 135–145)
TCO2: 26 mmol/L (ref 0–100)

## 2016-03-30 MED ORDER — ONDANSETRON 4 MG PO TBDP: 4.0000 mg | ORAL_TABLET | Freq: Three times a day (TID) | ORAL | Status: DC | PRN

## 2016-03-30 MED ORDER — ONDANSETRON 4 MG PO TBDP
4.0000 mg | ORAL_TABLET | Freq: Once | ORAL | Status: AC
  Administered 2016-03-30: 4 mg via ORAL

## 2016-03-30 MED ORDER — DICLOFENAC SODIUM 75 MG PO TBEC: 75.0000 mg | DELAYED_RELEASE_TABLET | Freq: Two times a day (BID) | ORAL | Status: DC

## 2016-03-30 MED ORDER — ONDANSETRON 4 MG PO TBDP
ORAL_TABLET | ORAL | Status: AC
  Filled 2016-03-30: qty 1

## 2016-03-30 NOTE — ED Provider Notes (Signed)
CSN: [id number]     Arrival date & time 03/30/16  1808 History   First MD Initiated Contact with Patient 03/30/16 1900     Chief Complaint  Patient presents with  . Dizziness   (Consider location/radiation/quality/duration/timing/severity/associated sxs/prior Treatment) HPI Comments: Patient is a 40 yo otherwise healthy female who presents with a syncopal episode and nausea. Patient was in her usual state of health until today. She had 2 teeth removed this am and following this was feeling well. She then had a Hydrocodone and 20 minutes later started to sweat and have nausea, per patient she fainted at that time.  She had only had a milkshake at that time. At 1700 she had another hydrocodone and the same feelings returned except she "fainted" again which was witness. This prompted the visit today. She now has nausea with emesis x 2 . She has a slight headache. No prior episodes or syncope. No weakness or change in vision. No abdominal pain is noted. There was a questionable "stiffning up in her muscle"  by the family member, but patient is not aware that this happened.  She is beginning to feel some improvement.   Patient is a 40 y.o. female presenting with dizziness. The history is provided by a relative. The history is limited by a language barrier.  Dizziness Associated symptoms: headaches, nausea and vomiting   Associated symptoms: no palpitations, no shortness of breath and no weakness     Past Medical History  Diagnosis Date  . No pertinent past medical history   . Gestational diabetes     1st and 2nd pregnancy  . Anemia     1st pregnancy  . Insertion of Nexplanon 02/21/2012    02/21/2012   History reviewed. No pertinent past surgical history. Family History  Problem Relation Age of Onset  . Diabetes Mother   . Hypertension Sister    Social History  Substance Use Topics  . Smoking status: Never Smoker   . Smokeless tobacco: Never Used  . Alcohol Use: No   OB History    Gravida Para Term Preterm AB TAB SAB Ectopic Multiple Living   4 4 4  0 0 0 0 0 0 4     Review of Systems  Constitutional: Negative for fatigue.  Respiratory: Negative for chest tightness and shortness of breath.   Cardiovascular: Negative.  Negative for palpitations.  Gastrointestinal: Positive for nausea and vomiting.  Genitourinary: Negative.   Musculoskeletal: Negative.   Skin: Negative.   Neurological: Positive for dizziness and headaches. Negative for weakness and numbness.  Psychiatric/Behavioral: Negative.     Allergies  Penicillins  Home Medications   Prior to Admission medications   Medication Sig Start Date End Date Taking? Authorizing Provider  acetaminophen (TYLENOL) 325 MG tablet Take 650 mg by mouth every 6 (six) hours as needed. Head ache    Historical Provider, MD  diclofenac (VOLTAREN) 75 MG EC tablet Take 1 tablet (75 mg total) by mouth 2 (two) times daily. 03/30/16   Riki SheerMichelle G Young, PA-C  doxycycline (VIBRA-TABS) 100 MG tablet Take 1 tablet (100 mg total) by mouth 2 (two) times daily. Patient not taking: Reported on 03/30/2016 03/22/14   Reuben Likesavid C Keller, MD  ondansetron (ZOFRAN-ODT) 4 MG disintegrating tablet Take 1 tablet (4 mg total) by mouth every 8 (eight) hours as needed for nausea or vomiting. 03/30/16   Riki SheerMichelle G Young, PA-C  prenatal vitamin w/FE, FA (PRENATAL 1 + 1) 27-1 MG TABS Take 1 tablet by mouth daily.  Historical Provider, MD  sulfamethoxazole-trimethoprim (BACTRIM DS) 800-160 MG per tablet Take 2 tablets by mouth 2 (two) times daily. Patient not taking: Reported on 03/30/2016 03/22/14   Reuben Likes, MD  sulfamethoxazole-trimethoprim (SEPTRA DS) 800-160 MG per tablet Take 1 tablet by mouth 2 (two) times daily. spanish Patient not taking: Reported on 03/30/2016 10/29/13   Rodolph Bong, MD   Meds Ordered and Administered this Visit   Medications  ondansetron (ZOFRAN-ODT) disintegrating tablet 4 mg (4 mg Oral Given 03/30/16 1952)    BP 139/92  mmHg  Pulse 81  Temp(Src) 98.2 F (36.8 C) (Oral)  Resp 16  SpO2 100% No data found.   Physical Exam  Constitutional: She is oriented to person, place, and time. She appears well-developed and well-nourished. No distress.  HENT:  Head: Normocephalic and atraumatic.  No signs of head trauma  Eyes: EOM are normal. Pupils are equal, round, and reactive to light.  Neck: Normal range of motion.  Cardiovascular: Normal rate.   Pulmonary/Chest: Effort normal.  Abdominal: Soft. Bowel sounds are normal.  Musculoskeletal: Normal range of motion.  Neurological: She is alert and oriented to person, place, and time. No cranial nerve deficit. She exhibits normal muscle tone. Coordination normal.  Skin: Skin is warm and dry. No rash noted. She is not diaphoretic.  Psychiatric: Her behavior is normal.  Nursing note and vitals reviewed.   ED Course  Procedures (including critical care time)  Labs Review Labs Reviewed  POCT I-STAT, CHEM 8 - Abnormal; Notable for the following:    Glucose, Bld 145 (*)    Hemoglobin 16.3 (*)    HCT 48.0 (*)    All other components within normal limits    Imaging Review No results found.   Visual Acuity Review  Right Eye Distance:   Left Eye Distance:   Bilateral Distance:    Right Eye Near:   Left Eye Near:    Bilateral Near:         MDM   1. Drug reaction, initial encounter   2. Fainting spell    Syncopal episode in the setting of a drug reaction (Hydrocodone). Work here normal, exam normal and no neuro deficit. Stop the medication. Treat nausea. May use diclofenac for pain. Observation by family and if another event then patient should be taken to the ED.      Riki Sheer, PA-C 03/30/16 2018

## 2016-03-30 NOTE — Discharge Instructions (Signed)
It appears you had a reaction to the pain medication. STOP the medication. Take Zofran for nausea as needed, and try to push fluids. May use the alternative medication i gave you, but must try and eat a soft something so that you can keep it down. Should you have another episode then please go to the ED for further evaluation.   Alergia a frmacos (Drug Allergy)  Alergia a un medicamento significa que le ha provocado una reaccin extraa. Puede aparecer hinchazn, picazn, una erupcin cutnea roja y sarpullido. Algunas reacciones alrgicas graves pueden poner en peligro la vida.  CUIDADOS EN EL HOGAR  Si no conoce la causa de la reaccin alrgica:   Anote los medicamentos que [place].  Detalle los problemas que ha sufrido luego de [employer].  Evite todo lo que cause una reaccin.  Puede consultar a Advertising copywriter para que Electrical engineer las pruebas de [place]. Si tiene un sarpullido o erupcin cutnea:   Tome los [employer] como le indic el mdico.  Aplique paos fros sobre la piel.  No tome baos o duchas calientes. Tome un bao de agua fresca. Si usted es muy alrgico:   Utilice un brazalete o collar de alerta mdico, indicando el tipo de Buyer, retail.  Lleve con usted un kit para la alergia o un medicamento inyectable para tratar las Chief of Staff graves. Esto puede salvar su vida.  Noconduzca vehculos hasta que los efectos de la inyeccin hayan desaparecido, excepto que el mdico lo autorice. SOLICITE AYUDA DE INMEDIATO SI:   Tiene la boca hinchada o dificultad para respirar.  Tiene una sensacin de opresin en el pecho o en la garganta.  Presenta sarpullido, hinchazn o picazn en todo el cuerpo.  Vomita o tiene diarrea.  Se siente dbil o se desvanece (se desmaya).  Piensa que tiene Nurse, mental health. Los problemas generalmente comienzan dentro de los 30 minutos de haber recibido Ecologist.  Siente que empeora o que no mejora.  Tiene nuevos  problemas.  Los problemas desaparecen, pero luego vuelven a Arts administrator. Esto es Engineer, maintenance (IT). Utilice la inyeccin o el kit para la alergia segn las indicaciones. Comunquese con el servicio de emergencias de su localidad (911 en los [place]) despus de recibir la inyeccin. Aunque despus de la inyeccin se sienta mejor, debe concurrir al hospital. [employer] necesitar ms medicamentos para controlar Nurse, mental health grave.  ASEGRESE DE QUE:   Comprende estas instrucciones.  Controlar su enfermedad.  Solicitar ayuda de inmediato si no mejora o si empeora.   Esta informacin no tiene Marine scientist el consejo del mdico. Asegrese de hacerle al mdico cualquier pregunta que tenga.   Document Released: 11/26/2005 Document Revised: 02/18/2012 Elsevier Interactive Patient Education [employer].

## 2016-03-30 NOTE — ED Notes (Signed)
Patient had 2 teeth removed today.  About 20 or 30 minutes after taking pain medicine, patient felt dizzy, nauseated and  syncopal episode. Passed out in bathroom, but came to in the hallway in the home.   Patient has vomited since being in ucc lobby. No diarrhea.  The first episode was unwitnessed.  The second episode was witnessed by family.  Patient stiffened, but awakened almost immediately, episode "lasted seconds" and patient was dizzy when event over , but new who family members were.

## 2017-03-07 ENCOUNTER — Ambulatory Visit (HOSPITAL_COMMUNITY)
Admission: EM | Admit: 2017-03-07 | Discharge: 2017-03-07 | Disposition: A | Discharge status: Home / Self Care | Payer: Self-pay | Attending: Internal Medicine | Admitting: Internal Medicine

## 2017-03-07 ENCOUNTER — Encounter (HOSPITAL_COMMUNITY): Payer: Self-pay | Admitting: Emergency Medicine

## 2017-03-07 DIAGNOSIS — J069 Acute upper respiratory infection, unspecified: Secondary | ICD-10-CM

## 2017-03-07 DIAGNOSIS — J029 Acute pharyngitis, unspecified: Secondary | ICD-10-CM | POA: Insufficient documentation

## 2017-03-07 DIAGNOSIS — B349 Viral infection, unspecified: Secondary | ICD-10-CM

## 2017-03-07 LAB — POCT RAPID STREP A: Streptococcus, Group A Screen (Direct): NEGATIVE

## 2017-03-07 NOTE — ED Triage Notes (Signed)
Pt c/o cold sx onset: 3-4 days  Sx include: prod cough, chest/back pain due to cough, chills, nasal congestion/drainage, HA, BA  Sts she has not checked her temp... Temp today = 100.7  Taking: OTC cold meds w/temp relief.   A&O x4... NAD

## 2017-03-07 NOTE — Discharge Instructions (Signed)
Sudafed PE 10 mg every 4 to 6 hours as needed for congestion Allegra or Zyrtec daily as needed for drainage and runny nose. For stronger antihistamine may take Chlor-Trimeton 2 to 4 mg every 4 to 6 hours, may cause drowsiness. Saline nasal spray used frequently. Ibuprofen 600 mg every 6 hours as needed for pain, discomfort or fever. Drink plenty of fluids and stay well-hydrated.  Cepacol lozenges for sore throat

## 2017-03-07 NOTE — ED Provider Notes (Signed)
CSN: [id number]     Arrival date & time 03/07/17  1719 History   First MD Initiated Contact with Patient 03/07/17 1813     Chief Complaint  Patient presents with  . URI   (Consider location/radiation/quality/duration/timing/severity/associated sxs/prior Treatment) 41 year old Spanish female complaining of a typical and minor sore throat starting about 4 days ago. Since that time her sore throat is a little worse and she has developed cough, headache, PND. Denies fever but has been feeling cold. She has pain across the chest associated with cough only. Currently temperature is 100.7 degrees.      History reviewed. No pertinent past medical history. History reviewed. No pertinent surgical history. History reviewed. No pertinent family history. Social History  Substance Use Topics  . Smoking status: Never Smoker  . Smokeless tobacco: Never Used  . Alcohol use No   OB History    No data available     Review of Systems  Constitutional: Positive for activity change and chills. Negative for appetite change, fatigue and fever.  HENT: Positive for congestion, postnasal drip, rhinorrhea and sore throat. Negative for facial swelling.   Eyes: Negative.   Respiratory: Negative.   Cardiovascular: Negative.   Gastrointestinal: Negative.   Musculoskeletal: Positive for myalgias. Negative for neck pain and neck stiffness.  Skin: Negative for pallor and rash.  Neurological: Negative.   All other systems reviewed and are negative.   Allergies  Patient has no known allergies.  Home Medications   Prior to Admission medications   Not on File   Meds Ordered and Administered this Visit  Medications - No data to display  BP (!) 149/98 (BP Location: Left Arm)   Pulse (!) 110   Temp (!) 100.7 F (38.2 C) (Oral)   Resp 18   SpO2 98%  No data found.   Physical Exam  Constitutional: She appears well-developed and well-nourished. No distress.  HENT:  Head: Normocephalic and  atraumatic.  Mouth/Throat: No oropharyngeal exudate.  Bilateral TMs are normal. Oropharynx with deep erythema and clear PND.  Eyes: EOM are normal.  Neck: Normal range of motion. Neck supple.  Cardiovascular: Normal rate and regular rhythm.   Pulmonary/Chest: Effort normal and breath sounds normal.  Lymphadenopathy:    She has no cervical adenopathy.  Neurological: She is alert. Coordination normal.  Skin: Skin is warm and dry.  Psychiatric: She has a normal mood and affect.  Nursing note and vitals reviewed.   Urgent Care Course     Procedures (including critical care time)  Labs Review Labs Reviewed  POCT RAPID STREP A   Results for orders placed or performed during the hospital encounter of 03/07/17  POCT rapid strep A [facility])  Result Value Ref Range   Streptococcus, Group A Screen (Direct) NEGATIVE NEGATIVE     Imaging Review No results found.   Visual Acuity Review  Right Eye Distance:   Left Eye Distance:   Bilateral Distance:    Right Eye Near:   Left Eye Near:    Bilateral Near:         MDM   1. Viral syndrome   2. Acute upper respiratory infection    Sudafed PE 10 mg every 4 to 6 hours as needed for congestion Allegra or Zyrtec daily as needed for drainage and runny nose. For stronger antihistamine may take Chlor-Trimeton 2 to 4 mg every 4 to 6 hours, may cause drowsiness. Saline nasal spray used frequently. Ibuprofen 600 mg every 6 hours as needed  for pain, discomfort or fever. Drink plenty of fluids and stay well-hydrated.  Cepacol lozenges for sore throat    Hayden Rasmussen, NP 03/07/17 1842

## 2017-03-09 ENCOUNTER — Encounter (HOSPITAL_COMMUNITY): Payer: Self-pay | Admitting: Emergency Medicine

## 2017-03-10 LAB — CULTURE, GROUP A STREP (THRC)

## 2017-10-20 LAB — GLUCOSE, POCT (MANUAL RESULT ENTRY): POC Glucose: 88 mg/dl (ref 70–99)

## 2018-03-03 ENCOUNTER — Other Ambulatory Visit: Payer: Self-pay | Admitting: Obstetrics & Gynecology

## 2018-03-03 DIAGNOSIS — Z1231 Encounter for screening mammogram for malignant neoplasm of breast: Secondary | ICD-10-CM

## 2019-09-14 ENCOUNTER — Encounter (INDEPENDENT_AMBULATORY_CARE_PROVIDER_SITE_OTHER): Payer: Self-pay | Admitting: Primary Care

## 2019-09-14 ENCOUNTER — Ambulatory Visit (INDEPENDENT_AMBULATORY_CARE_PROVIDER_SITE_OTHER): Payer: Self-pay | Admitting: Primary Care

## 2019-09-14 ENCOUNTER — Other Ambulatory Visit: Payer: Self-pay

## 2019-09-14 VITALS — BP 132/84 | HR 78 | Temp 97.6°F | Ht 60.0 in | Wt 175.4 lb

## 2019-09-14 DIAGNOSIS — Z7689 Persons encountering health services in other specified circumstances: Secondary | ICD-10-CM

## 2019-09-14 DIAGNOSIS — F329 Major depressive disorder, single episode, unspecified: Secondary | ICD-10-CM

## 2019-09-14 DIAGNOSIS — Z131 Encounter for screening for diabetes mellitus: Secondary | ICD-10-CM

## 2019-09-14 DIAGNOSIS — T733XXA Exhaustion due to excessive exertion, initial encounter: Secondary | ICD-10-CM

## 2019-09-14 DIAGNOSIS — Z23 Encounter for immunization: Secondary | ICD-10-CM

## 2019-09-14 DIAGNOSIS — R030 Elevated blood-pressure reading, without diagnosis of hypertension: Secondary | ICD-10-CM

## 2019-09-14 DIAGNOSIS — F32A Depression, unspecified: Secondary | ICD-10-CM

## 2019-09-14 LAB — POCT GLYCOSYLATED HEMOGLOBIN (HGB A1C): Hemoglobin A1C: 6.1 % — AB (ref 4.0–5.6)

## 2019-09-14 MED ORDER — ESCITALOPRAM OXALATE 10 MG PO TABS: 10.0000 mg | ORAL_TABLET | Freq: Every day | ORAL | 1 refills | Status: DC

## 2019-09-14 NOTE — Progress Notes (Signed)
New Patient Office Visit  Subjective:  Patient ID: Jade Potts, female    DOB: 1976/11/02  Age: 43 y.o. MRN: [id number]  CC:  Chief Complaint  Patient presents with  . New Patient (Initial Visit)    physical     HPI Cherice Glennie presents for establishment of care. 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  Past Medical History:  Diagnosis Date  . Anemia    1st pregnancy  . Gestational diabetes    1st and 2nd pregnancy  . Insertion of Nexplanon 02/21/2012   02/21/2012  . No pertinent past medical history     History reviewed. No pertinent surgical history.  Family History  Problem Relation Age of Onset  . Diabetes Mother   . Hypertension Sister     Social History   Socioeconomic History  . Marital status: Married    Spouse name: Not on file  . Number of children: Not on file  . Years of education: Not on file  . Highest education level: Not on file  Occupational History  . Not on file  Social Needs  . Financial resource strain: Not on file  . Food insecurity    Worry: Not on file    Inability: Not on file  . Transportation needs    Medical: Not on file    Non-medical: Not on file  Tobacco Use  . Smoking status: Never Smoker  . Smokeless tobacco: Never Used  Substance and Sexual Activity  . Alcohol use: No  . Drug use: No  . Sexual activity: Yes    Birth control/protection: Implant  Lifestyle  . Physical activity    Days per week: Not on file    Minutes per session: Not on file  . Stress: Not on file  Relationships  . Social Herbalist on phone: Not on file    Gets together: Not on file    Attends religious service: Not on file    Active member of club or organization: Not on file    Attends meetings of clubs or organizations: Not on file    Relationship status: Not on file  . Intimate partner violence    Fear of current or ex partner:  Not on file    Emotionally abused: Not on file    Physically abused: Not on file    Forced sexual activity: Not on file  Other Topics Concern  . Not on file  Social History Narrative   ** Merged History Encounter **        ROS Review of Systems  Constitutional: Positive for fatigue.  Endocrine: Positive for polyuria.  Neurological: Positive for dizziness.       Bed spins   All other systems reviewed and are negative.   Objective:   Today's Vitals: BP 132/84 (BP Location: Right Arm, Patient Position: Sitting, Cuff Size: Normal)   Pulse 78   Temp 97.6 F (36.4 C) (Temporal)   Ht 5' (1.524 m)   Wt 175 lb 6.4 oz (79.6 kg)   SpO2 95%   BMI 34.26 kg/m   Physical Exam Vitals signs reviewed.  Constitutional:      Appearance: Normal appearance.  HENT:     Head: Normocephalic.     Right Ear: Tympanic membrane normal.     Left Ear: Tympanic membrane normal.  Eyes:     Extraocular Movements: Extraocular  movements intact.     Pupils: Pupils are equal, round, and reactive to light.  Neck:     Musculoskeletal: Normal range of motion and neck supple.  Cardiovascular:     Rate and Rhythm: Normal rate and regular rhythm.  Pulmonary:     Effort: Pulmonary effort is normal.     Breath sounds: Normal breath sounds.  Musculoskeletal: Normal range of motion.  Skin:    General: Skin is warm.  Neurological:     General: No focal deficit present.     Mental Status: She is alert.  Psychiatric:     Comments: Sadness some times     Assessment & Plan:  [name] was seen today for new patient (initial visit).  Diagnoses and all orders for this visit:  Encounter to establish care  Depression, unspecified depression type She is over whelm complete life style changes she was crying during this visit and expressed via interpretor  How she felt and why. Recommend and prescribe low dose of SSRI Lexapro and follow up on medication and to see clinical social worker for more resources.  Patient in agreement.  Elevated blood pressure reading in office without diagnosis of hypertension 134/84 she has not had primary care in a long time. Discussed  Diet, to include low sodium and reading label  for sodium and nitrates  Diabetes mellitus screening -     HgB A1c 6.1 Prediabetes. Discussed this can be manage by reducing foods that are high in carbohydrates are the following rice, potatoes, breads, sugars, and pastas.  Reduction in the intake (eating) will assist in lowering your blood sugars.excercise 30 mins daily.  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 Thus far there has been not help or break for her. During this visit referred her husband for nursing to assess for a CNA to come to the home for ADL's . She was very Adult nurse.  Need for immunization against influenza -     Flu Vaccine QUAD 36+ mos IM  Other orders -     escitalopram (LEXAPRO) 10 MG tablet; Take 1 tablet (10 mg total) by mouth daily.    Outpatient Encounter Medications as of 09/14/2019  Medication Sig  . escitalopram (LEXAPRO) 10 MG tablet Take 1 tablet (10 mg total) by mouth daily.  . [DISCONTINUED] acetaminophen (TYLENOL) 325 MG tablet Take 650 mg by mouth every 6 (six) hours as needed. Head ache  . [DISCONTINUED] diclofenac (VOLTAREN) 75 MG EC tablet Take 1 tablet (75 mg total) by mouth 2 (two) times daily.  . [DISCONTINUED] doxycycline (VIBRA-TABS) 100 MG tablet Take 1 tablet (100 mg total) by mouth 2 (two) times daily. (Patient not taking: Reported on 03/30/2016)  . [DISCONTINUED] ondansetron (ZOFRAN-ODT) 4 MG disintegrating tablet Take 1 tablet (4 mg total) by mouth every 8 (eight) hours as needed for nausea or vomiting.  . [DISCONTINUED] prenatal vitamin w/FE, FA (PRENATAL 1 + 1) 27-1 MG TABS Take 1 tablet by mouth daily.    . [DISCONTINUED]  sulfamethoxazole-trimethoprim (BACTRIM DS) 800-160 MG per tablet Take 2 tablets by mouth 2 (two) times daily. (Patient not taking: Reported on 03/30/2016)  . [DISCONTINUED] sulfamethoxazole-trimethoprim (SEPTRA DS) 800-160 MG per tablet Take 1 tablet by mouth 2 (two) times daily. spanish (Patient not taking: Reported on 03/30/2016)   No facility-administered encounter medications on file as of 09/14/2019.  Follow-up: Return in about 6 weeks (around 10/26/2019) for CSW medication management depression started on Lexapro .   Grayce SessionsMichelle P Neelam Tiggs, NP

## 2019-09-14 NOTE — Patient Instructions (Signed)
Plan de alimentacin para personas con prediabetes Prediabetes Eating Plan La prediabetes es una afeccin que hace que los niveles de azcar en la sangre (glucosa) sean ms altos de lo normal. Esto aumenta el riesgo de tener diabetes. Para prevenir la diabetes, es posible que su mdico le recomiende cambios en la dieta y otros cambios en su estilo de vida que lo ayuden a lograr lo siguiente:  Controlar los niveles de glucemia.  Mejorar los niveles de colesterol.  Controlar la presin arterial. El mdico puede recomendarle que trabaje con un especialista en alimentacin y nutricin (nutricionista) para elaborar el plan de comidas ms conveniente para usted. Consejos para seguir este plan: Estilo de vida  Establezca metas para bajar de peso con la ayuda de su equipo de atencin mdica. A la mayora de las personas con prediabetes se les recomienda bajar un 7% de su peso corporal.  Haga ejercicio al menos 30minutos 5das por semana, como mnimo.  Asista a un grupo de apoyo o solicite el apoyo continuo de un consejero de salud mental.  Tome los medicamentos de venta libre y los recetados solamente como se lo haya indicado el mdico. Leer las etiquetas de los alimentos  Lea las etiquetas de los alimentos envasados para controlar la cantidad de grasa, sal (sodio) y azcar que contienen. Evite los alimentos que contengan lo siguiente: ? Grasas saturadas. ? Grasas trans. ? Azcares agregados.  Evite los alimentos que contengan ms de 300miligramos(mg) de sodio por porcin. Limite el consumo diario de sodio a menos de 2300mg por da. De compras  Evite comprar alimentos procesados y preelaborados. Coccin  Cocine con aceite de oliva. No use mantequilla, manteca de cerdo o mantequilla clarificada.  Cocine los alimentos al horno, a la parrilla, asados o hervidos. Evite frerlos. Planificacin de las comidas   Trabaje con el nutricionista para crear un plan de alimentacin que sea  adecuado para usted. Esto puede incluir lo siguiente: ? Registro de la cantidad de caloras que ingiere. Use un registro de alimentos, un cuaderno o una aplicacin mvil para anotar lo que comi en cada comida. ? Uso del ndice glucmico (IG) para planificar las comidas. El ndice muestra con qu rapidez elevar la glucemia un alimento. Elija alimentos con bajo IG. Estos demoran ms en elevar la glucemia.  Considere la posibilidad de seguir una dieta mediterrnea. Esta dieta incluye lo siguiente: ? Varias porciones de frutas y verduras frescas por da. ? Pescado al menos dos veces por semana. ? Varias porciones de cereales integrales, frijoles, frutos secos y semillas por da. ? Aceite de oliva en lugar de otras grasas. ? Consumo moderado de alcohol. ? Pequeas cantidades de carnes rojas y lcteos enteros.  Si tiene hipertensin arterial, quizs deba limitar el consumo de sodio o seguir una dieta como el plan de alimentacin basado en Enfoques Alimentarios para Detener la Hipertensin (Dietary Approaches to Stop Hypertension, DASH). Este es un plan de alimentacin cuyo objetivo es bajar la hipertensin arterial. Qu alimentos se recomiendan? Es posible que los alimentos incluidos a continuacin no constituyan la lista completa. Hable con el nutricionista sobre las mejores opciones alimenticias para usted. Cereales Productos integrales, como panes, galletas, cereales y pastas de salvado o integrales. Avena sin azcar. Trigo burgol. Cebada. Quinua. Arroz integral. Tacos o tortillas de harina de maz o de salvado. Verduras Lechuga. Espinaca. Guisantes. Remolachas. Coliflor. Repollo. Brcoli. Zanahorias. Tomates. Calabaza. Berenjena. Hierbas. Pimienta. Cebollas. Pepinos. Repollitos de Bruselas. Frutas Frutos rojos. Bananas. Manzanas. Naranjas. Uvas. Papaya. Mango. Granada. Kiwi. Pomelo.   Cerezas. Carnes y otros alimentos ricos en protenas Mariscos. Carne de ave sin piel. Cortes magros de cerdo y  carne de res. Tofu. Huevos. Frutos secos. Frijoles. Lcteos Productos lcteos descremados o semidescremados, como yogur, queso cottage y queso. Bebidas Agua. T. Caf. Gaseosas sin azcar o dietticas. Soda. Leche descremada o semidescremada. Productos alternativos a la leche, como leche de soja o de almendras. Grasas y aceites Aceite de oliva. Aceite de canola. Aceite de girasol. Aceite de semillas de uva. Aguacate. Nueces. Dulces y postres Pudin sin azcar o con bajo contenido de grasa. Helado y otros postres congelados sin azcar o con bajo contenido de grasa. Condimentos y otros alimentos Hierbas. Especias sin sodio. Mostaza. Salsa de pepinillos. Ktchup con bajo contenido de grasa y de azcar. Salsa barbacoa con bajo contenido de grasa y de azcar. Mayonesa con bajo contenido de grasa o sin grasa. Qu alimentos no se recomiendan? Es posible que los alimentos incluidos a continuacin no constituyan la lista completa. Hable con el nutricionista sobre las mejores opciones alimenticias para usted. Cereales Productos elaborados con harina y harina blanca refinada, como panes, pastas, bocadillos y cereales. Verduras Verduras enlatadas. Verduras congeladas con mantequilla o salsa de crema. Frutas Frutas enlatadas al almbar. Carnes y otros alimentos ricos en protenas Cortes de carne con grasa. Carne de ave con piel. Carne empanizada o frita. Carnes procesadas. Lcteos Yogur, queso o leche enteros. Bebidas Bebidas azucaradas, como t helado dulce y gaseosas. Grasas y aceites Mantequilla. Manteca de cerdo. Mantequilla clarificada. Dulces y postres Productos horneados, como pasteles, pastelitos, galletas dulces y tarta de queso. Condimentos y otros alimentos Mezclas de especias con sal agregada. Ktchup. Salsa barbacoa. Mayonesa. Resumen  Para prevenir la diabetes, es posible que deba hacer cambios en la dieta y otros cambios en su estilo de vida para ayudar a controlar el azcar en la  sangre, mejorar los niveles de colesterol y controlar la presin arterial.  Establezca metas para bajar de peso con la ayuda de su equipo de atencin mdica. A la mayora de las personas con prediabetes se les recomienda bajar un 7por ciento de su peso corporal.  Considere la posibilidad de seguir una dieta mediterrnea que incluya muchas frutas y verduras frescas, cereales integrales, frijoles, frutos secos, semillas, pescado, carnes magras, lcteos descremados y aceites saludables. Esta informacin no tiene como fin reemplazar el consejo del mdico. Asegrese de hacerle al mdico cualquier pregunta que tenga. Document Released: 08/17/2015 Document Revised: 06/10/2017 Document Reviewed: 06/10/2017 Elsevier Patient Education  2020 [employer]  

## 2019-09-16 ENCOUNTER — Ambulatory Visit (INDEPENDENT_AMBULATORY_CARE_PROVIDER_SITE_OTHER): Payer: Self-pay | Admitting: Primary Care

## 2019-11-03 ENCOUNTER — Institutional Professional Consult (permissible substitution) (INDEPENDENT_AMBULATORY_CARE_PROVIDER_SITE_OTHER): Payer: Self-pay | Admitting: Licensed Clinical Social Worker

## 2019-11-03 ENCOUNTER — Encounter (INDEPENDENT_AMBULATORY_CARE_PROVIDER_SITE_OTHER): Payer: Self-pay | Admitting: Primary Care

## 2019-11-03 ENCOUNTER — Ambulatory Visit (INDEPENDENT_AMBULATORY_CARE_PROVIDER_SITE_OTHER): Payer: Self-pay | Admitting: Primary Care

## 2019-11-03 ENCOUNTER — Other Ambulatory Visit: Payer: Self-pay

## 2019-11-03 VITALS — BP 131/87 | HR 62 | Temp 98.1°F | Resp 16 | Wt 175.8 lb

## 2019-11-03 DIAGNOSIS — F329 Major depressive disorder, single episode, unspecified: Secondary | ICD-10-CM

## 2019-11-03 DIAGNOSIS — I10 Essential (primary) hypertension: Secondary | ICD-10-CM

## 2019-11-03 DIAGNOSIS — F32A Depression, unspecified: Secondary | ICD-10-CM

## 2019-11-03 MED ORDER — HYDROCHLOROTHIAZIDE 25 MG PO TABS: 25.0000 mg | ORAL_TABLET | Freq: Every day | ORAL | 3 refills | Status: DC

## 2019-11-03 MED ORDER — ESCITALOPRAM OXALATE 10 MG PO TABS: 10.0000 mg | ORAL_TABLET | Freq: Every day | ORAL | 1 refills | Status: DC

## 2019-11-03 NOTE — Progress Notes (Signed)
Follow up.

## 2019-11-03 NOTE — Progress Notes (Signed)
Established Patient Office Visit  Subjective:  Patient ID: Jade Potts, female    DOB: 1976/02/09  Age: 43 y.o. MRN: [id number]  CC:  Chief Complaint  Patient presents with  . Follow-up    HPI Jade Potts presents for follow up on medication recently Lexapro  10 mg feels a little asked does it means needing adjusting no it is fine no need for change. Also, helps her rest and sleep.  Past Medical History:  Diagnosis Date  . Anemia    1st pregnancy  . Gestational diabetes    1st and 2nd pregnancy  . Insertion of Nexplanon 02/21/2012   02/21/2012  . No pertinent past medical history     No past surgical history on file.  Family History  Problem Relation Age of Onset  . Diabetes Mother   . Hypertension Sister     Social History   Socioeconomic History  . Marital status: Married    Spouse name: Not on file  . Number of children: Not on file  . Years of education: Not on file  . Highest education level: Not on file  Occupational History  . Not on file  Social Needs  . Financial resource strain: Not on file  . Food insecurity    Worry: Not on file    Inability: Not on file  . Transportation needs    Medical: Not on file    Non-medical: Not on file  Tobacco Use  . Smoking status: Never Smoker  . Smokeless tobacco: Never Used  Substance and Sexual Activity  . Alcohol use: No  . Drug use: No  . Sexual activity: Yes    Birth control/protection: Implant  Lifestyle  . Physical activity    Days per week: Not on file    Minutes per session: Not on file  . Stress: Not on file  Relationships  . Social Musician on phone: Not on file    Gets together: Not on file    Attends religious service: Not on file    Active member of club or organization: Not on file    Attends meetings of clubs or organizations: Not on file    Relationship status: Not on file  . Intimate partner violence    Fear of current or ex partner: Not on file     Emotionally abused: Not on file    Physically abused: Not on file    Forced sexual activity: Not on file  Other Topics Concern  . Not on file  Social History Narrative   ** Merged History Encounter **        Outpatient Medications Prior to Visit  Medication Sig Dispense Refill  . escitalopram (LEXAPRO) 10 MG tablet Take 1 tablet (10 mg total) by mouth daily. 30 tablet 1   No facility-administered medications prior to visit.     Allergies  Allergen Reactions  . Codeine Anaphylaxis    fainted  . Penicillins Rash    ROS Review of Systems  All other systems reviewed and are negative.     Objective:    Physical Exam  Constitutional: She is oriented to person, place, and time. She appears well-developed and well-nourished.  HENT:  Head: Normocephalic.  Neck: Neck supple.  Cardiovascular: Normal rate and regular rhythm.  Pulmonary/Chest: Effort normal and breath sounds normal.  Abdominal: Bowel sounds are normal.  Musculoskeletal: Normal range of motion.  Neurological: She is oriented to person, place, and time.  Psychiatric: She has a normal mood and affect.    BP 131/87   Pulse 62   Temp 98.1 F (36.7 C) (Oral)   Resp 16   Wt 175 lb 12.8 oz (79.7 kg)   SpO2 97%   BMI 34.33 kg/m  Wt Readings from Last 3 Encounters:  11/03/19 175 lb 12.8 oz (79.7 kg)  09/14/19 175 lb 6.4 oz (79.6 kg)  02/21/12 157 lb 12.8 oz (71.6 kg)     Health Maintenance Due  Topic Date Due  . PAP SMEAR-Modifier  10/10/1998    There are no preventive care reminders to display for this patient.  No results found for: TSH Lab Results  Component Value Date   WBC 7.7 01/08/2012   HGB 16.3 (H) 03/30/2016   HCT 48.0 (H) 03/30/2016   MCV 80.0 01/08/2012   PLT 171 01/08/2012   Lab Results  Component Value Date   NA 142 03/30/2016   K 4.1 03/30/2016   GLUCOSE 145 (H) 03/30/2016   BUN 9 03/30/2016   CREATININE 0.60 03/30/2016   No results found for: CHOL No results found  for: HDL No results found for: LDLCALC No results found for: TRIG No results found for: [facility] Lab Results  Component Value Date   HGBA1C 6.1 (A) 09/14/2019      Assessment & Plan:  Jade Potts was seen today for follow-up.  Diagnoses and all orders for this visit:  Essential hypertension New diagnosis reviewed the last 3 separate blood pressure reading all were < 130/80 whish is dx for hypertension Counseled on, low-sodium diet, medication compliance, 150 minutes of moderate intensity exercise per week. Discussed medication compliance, adverse effects.  Depression, unspecified depression type She has improved on the Lexapro and knowing she is getting help to care for her husband may also be a contributing factor. Other orders -     hydrochlorothiazide (HYDRODIURIL) 25 MG tablet; Take 1 tablet (25 mg total) by mouth daily.   No orders of the defined types were placed in this encounter.   Follow-up: No follow-ups on file.    Kerin Perna, NP

## 2019-11-03 NOTE — Patient Instructions (Signed)
Cmo controlar su hipertensin Managing Your Hypertension La hipertensin se denomina usualmente presin arterial alta. Ocurre cuando la sangre presiona contra las paredes de las arterias con demasiada fuerza. Las arterias son los vasos sanguneos que transportan la sangre desde el corazn hacia todas las partes del cuerpo. La hipertensin hace que el corazn haga ms esfuerzo para Chiropodist y [employer] que las arterias se Teacher, music o Advertising account executive. La hipertensin no tratada o no controlada puede causar infarto de miocardio, accidente cerebrovascular, enfermedad renal y otros problemas. Qu son las Futures trader de presin arterial? Una lectura de la presin arterial consiste de un nmero ms alto sobre un nmero ms bajo. En condiciones ideales, la presin arterial debe estar por debajo de 120/80. El primer nmero ("superior") es la presin sistlica. Es la medida de la presin de las arterias cuando el corazn late. El segundo nmero ("inferior") es la presin diastlica. Es la medida de la presin en las arterias cuando el corazn se relaja. Qu significa mi lectura de presin arterial? La presin arterial se clasifica en cuatro etapas. Sobre la base de la lectura de su presin arterial, el mdico puede usar las siguientes etapas para determinar si necesita tratamiento y de qu tipo. La presin sistlica y la presin diastlica se miden en una unidad llamada mm Hg. Normal  Presin sistlica: por debajo de 409.  Presin diastlica: por debajo de 80. Elevada  Presin sistlica: 811-914.  Presin diastlica: por debajo de 80. Etapa 1 de hipertensin  Presin sistlica: 782-956.  Presin diastlica: 21-30. Etapa 2 de hipertensin  Presin sistlica: 865 o ms.  Presin diastlica: 90 o ms. Cules son los riesgos para la salud asociados con la hipertensin? Controlar la hipertensin es una responsabilidad importante. La hipertensin no controlada puede causar:  Infarto de  miocardio.  Accidente cerebrovascular.  Debilitamiento de los vasos sanguneos (aneurisma).  Insuficiencia cardaca.  Dao renal.  Dao ocular.  Sndrome metablico.  Problemas de memoria y concentracin. Qu cambios puedo hacer para controlar mi hipertensin? La hipertensin se puede controlar haciendo [employer] estilo de vida y, posiblemente, tomando medicamentos. Su mdico le ayudar a crear un plan para bajar la presin arterial al rango normal. Comida y bebida   Siga una dieta con alto contenido de fibras y [place], y con bajo contenido de sal (sodio), azcar agregada y Daphene Jaeger. Un ejemplo de plan alimenticio es la dieta DASH (Dietary Approaches to Stop Hypertension, Mtodos alimenticios para detener la hipertensin). Para alimentarse de esta manera: ? Coma mucha fruta y [place]. Trate de que la mitad del plato de cada comida sea de frutas y verduras. ? Coma cereales integrales, como pasta integral, arroz integral y pan integral. Llene aproximadamente un cuarto del plato con cereales integrales. ? Consuma productos lcteos con bajo contenido de grasa. ? Evite la ingesta de cortes de carne grasa, carne procesada o curada, y carne de ave con piel. Llene aproximadamente un cuarto del plato con protenas magras, como pescado, pollo sin piel, frijoles, huevos y tofu. ? Evite ingerir alimentos prehechos o procesados. En general, estos tienen mayor cantidad de sodio, azcar agregada y Wendee Copp.  Reduzca su ingesta diaria de sodio. La mayora de las personas que tienen hipertensin deben comer menos de 1500 mg de sodio por [employer].  Limite el consumo de alcohol a no ms de 1 medida por da si es mujer y no est Music therapist y a 2 medidas por da si es hombre. Una medida equivale a 12onzas de cerveza, Public house manager de vino  o 1onzas de bebidas alcohlicas de alta graduacin. Estilo de vida  Trabaje con su mdico para mantener un peso saludable o Administrator, Civil Service. Pregntele cual es su peso  recomendado.  Realice al menos 30 minutos de ejercicio que haga que se acelere su corazn (ejercicio Arboriculturist) la [employer] de la [place]. Estas actividades pueden incluir caminar, nadar o andar en bicicleta.  Incluya ejercicios para fortalecer sus msculos (ejercicios de resistencia), como levantamiento de pesas, como parte de su rutina semanal de ejercicios. Intente realizar 78minutos de este tipo de ejercicios al [employer] a la [place].  No consuma ningn producto que contenga nicotina o tabaco, como cigarrillos y Psychologist, sport and exercise. Si necesita ayuda para dejar de fumar, consulte al [employer].  Controle las enfermedades a largo plazo (crnicas), como el colesterol alto o la diabetes. Control  Contrlese la presin arterial en su casa segn las indicaciones del mdico. La presin arterial deseada puede variar en funcin de las enfermedades, la edad y otros factores personales.  Contrlese la presin arterial de manera regular, en la frecuencia indicada por su mdico. Trabaje con su mdico  Revise con su mdico todos los medicamentos que toma ya que puede haber efectos secundarios o interacciones.  Hable con su mdico acerca de la dieta, hbitos de ejercicio y otros factores del estilo de vida que pueden contribuir a la hipertensin.  Consulte a su mdico regularmente. Su mdico puede ayudarle a crear y [place] su plan para controlar la hipertensin. Debo tomar un medicamento para controlar mi presin arterial? El mdico puede recetarle medicamentos si los cambios en el estilo de vida no son suficientes para Child psychotherapist la presin arterial y si:  Su presin arterial sistlica es de 176 o ms.  Su presin arterial diastlica es de 80 o ms. Tome los medicamentos solamente como se lo haya indicado el mdico. Siga cuidadosamente las indicaciones. Los medicamentos para la presin arterial deben tomarse segn las indicaciones. Los medicamentos pierden eficacia al  omitir las dosis. El hecho de omitir las dosis tambin [place] el riesgo de otros problemas. Comunquese con un mdico si:  Piensa que tiene Nurse, mental health a los medicamentos que ha tomado.  Tiene dolores de cabeza frecuentes (recurrentes).  Siente mareos.  Tiene hinchazn en los tobillos.  Tiene problemas de visin. Solicite ayuda de inmediato si:  Siente un dolor de cabeza intenso o confusin.  Siente debilidad inusual, adormecimiento o que Geneticist, molecular.  Siente un dolor intenso en el pecho o el abdomen.  Vomita repetidas veces.  Tiene dificultad para respirar. Resumen  La hipertensin se produce cuando la sangre bombea en las arterias con mucha fuerza. Si esta afeccin no se controla, podra correr riesgo de tener complicaciones graves.  La presin arterial deseada puede variar en funcin de las enfermedades, la edad y otros factores personales. Para la [employer], una presin arterial normal es menor que 120/80.  La hipertensin se puede controlar mediante cambios en el estilo de vida, tomando medicamentos, o ambas cosas. Los [employer] Corporation estilo de vida incluyen prdida de peso, ingerir alimentos sanos, seguir una dieta baja en sodio, hacer ms ejercicio y Environmental consultant consumo de alcohol. Esta informacin no tiene Marine scientist el consejo del mdico. Asegrese de hacerle al mdico cualquier pregunta que tenga. Document Released: 08/20/2012 Document Revised: 02/04/2017 Document Reviewed: 11/07/2016 Elsevier Patient Education  2020 [employer].

## 2019-12-15 ENCOUNTER — Other Ambulatory Visit: Payer: Self-pay

## 2019-12-15 ENCOUNTER — Encounter (INDEPENDENT_AMBULATORY_CARE_PROVIDER_SITE_OTHER): Payer: Self-pay | Admitting: Primary Care

## 2019-12-15 ENCOUNTER — Ambulatory Visit (INDEPENDENT_AMBULATORY_CARE_PROVIDER_SITE_OTHER): Payer: Self-pay | Admitting: Primary Care

## 2019-12-15 DIAGNOSIS — F329 Major depressive disorder, single episode, unspecified: Secondary | ICD-10-CM

## 2019-12-15 DIAGNOSIS — I10 Essential (primary) hypertension: Secondary | ICD-10-CM

## 2019-12-15 DIAGNOSIS — Z760 Encounter for issue of repeat prescription: Secondary | ICD-10-CM

## 2019-12-15 DIAGNOSIS — F32A Depression, unspecified: Secondary | ICD-10-CM

## 2019-12-15 MED ORDER — ESCITALOPRAM OXALATE 10 MG PO TABS: 10.0000 mg | ORAL_TABLET | Freq: Every day | ORAL | 1 refills | Status: DC

## 2019-12-15 MED ORDER — HYDROCHLOROTHIAZIDE 25 MG PO TABS: 25.0000 mg | ORAL_TABLET | Freq: Every day | ORAL | 1 refills | Status: DC

## 2019-12-15 NOTE — Progress Notes (Signed)
142/83 at 3:18 left arm, pt has taken antihypertensives

## 2019-12-15 NOTE — Progress Notes (Signed)
Virtual Visit via Telephone Note  I connected with Jade Potts on 12/15/19 at  3:30 PM EST by telephone and verified that I am speaking with the correct person using two identifiers.   I discussed the limitations, risks, security and privacy concerns of performing an evaluation and management service by telephone and the availability of in person appointments. I also discussed with the patient that there may be a patient responsible charge related to this service. The patient expressed understanding and agreed to proceed.   History of Present Illness: Ms. Jade Potts is having a tele visit for blood pressure follow up on HCTZ 25 mg Bp 130/61  Past Medical History:  Diagnosis Date  . Anemia    1st pregnancy  . Gestational diabetes    1st and 2nd pregnancy  . Insertion of Nexplanon 02/21/2012   02/21/2012  . No pertinent past medical history    No current outpatient medications on file prior to visit.   No current facility-administered medications on file prior to visit.     Observations/Objective: Review of Systems  All other systems reviewed and are negative.  Assessment and Plan: Jade Potts was seen today for blood pressure check and medication refill.  Diagnoses and all orders for this visit:  Essential hypertension BP goal - < 130/80 Explained that having normal blood pressure is the goal and medications are helping to get to goal and maintain normal blood pressure. DIET: Limit salt intake, read nutrition labels to check salt content, limit fried and high fatty foods  EXERCISE Discussed incorporating exercise such as walking - 30 minutes most days of the week and can do in 10 minute intervals   Continue taking HCTZ 25mg  daily  -     hydrochlorothiazide (HYDRODIURIL) 25 MG tablet; Take 1 tablet (25 mg total) by mouth daily.  Depression, unspecified depression type Depression can affect thoughts, feelings & sleep. May lead to changes in mood  ,relationships, daily activities & physical health at this time patient feels lexapro 10mg  is affective and helping continue medication -      escitalopram (LEXAPRO) 10 MG tablet; Take 1 tablet (10 mg total) by mouth daily.    Follow Up Instructions:    I discussed the assessment and treatment plan with the patient. The patient was provided an opportunity to ask questions and all were answered. The patient agreed with the plan and demonstrated an understanding of the instructions.   The patient was advised to call back or seek an in-person evaluation if the symptoms worsen or if the condition fails to improve as anticipated.  I provided 12  minutes of non-face-to-face time during this encounter.   , NP

## 2021-10-02 ENCOUNTER — Other Ambulatory Visit: Payer: Self-pay | Admitting: Obstetrics & Gynecology

## 2021-10-02 DIAGNOSIS — Z1231 Encounter for screening mammogram for malignant neoplasm of breast: Secondary | ICD-10-CM

## 2021-10-10 ENCOUNTER — Other Ambulatory Visit: Payer: Self-pay

## 2021-10-10 ENCOUNTER — Ambulatory Visit
Admission: RE | Admit: 2021-10-10 | Discharge: 2021-10-10 | Disposition: A | Discharge status: Home / Self Care | Payer: No Typology Code available for payment source | Source: Ambulatory Visit | Attending: Obstetrics & Gynecology | Admitting: Obstetrics & Gynecology

## 2021-10-10 DIAGNOSIS — Z1231 Encounter for screening mammogram for malignant neoplasm of breast: Secondary | ICD-10-CM

## 2021-10-12 ENCOUNTER — Other Ambulatory Visit: Payer: Self-pay

## 2021-10-12 ENCOUNTER — Ambulatory Visit
Admission: RE | Admit: 2021-10-12 | Discharge: 2021-10-12 | Disposition: A | Discharge status: Home / Self Care | Payer: No Typology Code available for payment source | Source: Ambulatory Visit | Attending: Obstetrics & Gynecology | Admitting: Obstetrics & Gynecology

## 2023-04-04 ENCOUNTER — Telehealth: Payer: Self-pay

## 2024-05-14 ENCOUNTER — Ambulatory Visit (HOSPITAL_COMMUNITY): Admission: EM | Admit: 2024-05-14 | Discharge: 2024-05-14 | Disposition: A | Discharge status: Home / Self Care

## 2024-05-14 ENCOUNTER — Encounter (HOSPITAL_COMMUNITY): Payer: Self-pay

## 2024-05-14 DIAGNOSIS — J069 Acute upper respiratory infection, unspecified: Secondary | ICD-10-CM | POA: Diagnosis not present

## 2024-05-14 HISTORY — DX: Essential (primary) hypertension: I10

## 2024-05-14 MED ORDER — PROMETHAZINE-DM 6.25-15 MG/5ML PO SYRP: 5.0000 mL | ORAL_SOLUTION | Freq: Four times a day (QID) | ORAL | 0 refills | Status: AC | PRN

## 2024-05-14 MED ORDER — BENZONATATE 100 MG PO CAPS: 100.0000 mg | ORAL_CAPSULE | Freq: Three times a day (TID) | ORAL | 0 refills | Status: AC | PRN

## 2024-05-14 MED ORDER — CYCLOBENZAPRINE HCL 10 MG PO TABS
10.0000 mg | ORAL_TABLET | Freq: Two times a day (BID) | ORAL | 0 refills | Status: AC | PRN
Start: 2024-05-14 — End: ?

## 2024-05-14 NOTE — Discharge Instructions (Signed)
 The tessalon cough pills can be taken 3 times daily. The promethazine DM cough syrup can be used up to 4 times daily. If this medication makes you drowsy, take only once before bed. You can take the muscle relaxer Flexeril twice daily. This can help with chest and back discomfort. Please be cautious as this medicine may make you drowsy as well. You can use tylenol  and/or ibuprofen  for other pain Drink LOTS of water It may take up to a week for improvement of symptoms   Las pastillas para la tos Tessalon se pueden tomar 3 veces al da. El jarabe para la tos Prometazina DM se puede usar hasta 4 veces al da. Si este medicamento le produce somnolencia, tmelo solo una vez antes de [place]. Puede tomar Teaching laboratory technician Flexeril dos veces al da. Esto puede ayudar con las [employer] pecho y la espalda. Tenga cuidado, ya que este medicamento tambin puede causar somnolencia. Puede usar Tylenol  o ibuprofeno para otros dolores. Beba mucha agua. Los sntomas pueden tardar Jannetta Men semana en mejorar.

## 2024-05-14 NOTE — ED Provider Notes (Signed)
 [facility]    CSN: [id number] Arrival date & time: 05/14/24  7829      History   Chief Complaint Chief Complaint  Patient presents with   Cough    HPI Jade Potts is a 48 y.o. female.  Medical interpreter used for encounter 3-day history of cough.  Nonproductive.  Some nasal congestion. Cough is worse at night when laying flat. She denies any shortness of breath or wheezing Some rib and back discomfort with cough No fever or chills  No known sick contacts Denies recent travel  Tried an OTC cough medicine last night  Past Medical History:  Diagnosis Date   Anemia    1st pregnancy   Gestational diabetes    1st and 2nd pregnancy   Hypertension    Insertion of Nexplanon  02/21/2012   02/21/2012   No pertinent past medical history     Patient Active Problem List   Diagnosis Date Noted   Insertion of Nexplanon  02/21/2012    History reviewed. No pertinent surgical history.  OB History     Gravida  4   Para  4   Term  4   Preterm  0   AB  0   Living  4      SAB  0   IAB  0   Ectopic  0   Multiple      Live Births  4            Home Medications    Prior to Admission medications   Medication Sig Start Date End Date Taking? Authorizing Provider  benzonatate (TESSALON) 100 MG capsule Take 1 capsule (100 mg total) by mouth 3 (three) times daily as needed for cough. 05/14/24  Yes Talar Fraley, Ivette Marks, PA-C  cyclobenzaprine (FLEXERIL) 10 MG tablet Take 1 tablet (10 mg total) by mouth 2 (two) times daily as needed for muscle spasms. 05/14/24  Yes Kenetha Cozza, Ivette Marks, PA-C  metFORMIN (GLUCOPHAGE) 500 MG tablet Take by mouth 2 (two) times daily with a meal.   Yes [provider]  promethazine-dextromethorphan (PROMETHAZINE-DM) 6.25-15 MG/5ML syrup Take 5 mLs by mouth 4 (four) times daily as needed for cough. 05/14/24  Yes Daisean Brodhead, Ivette Marks, PA-C    Family History Family History  Problem Relation Age of Onset   Diabetes Mother     Hypertension Sister    Breast cancer Maternal Aunt     Social History Social History   Tobacco Use   Smoking status: Never   Smokeless tobacco: Never  Vaping Use   Vaping status: Never Used  Substance Use Topics   Alcohol use: No   Drug use: No     Allergies   Codeine and Penicillins   Review of Systems Review of Systems  Respiratory:  Positive for cough.    Per HPI  Physical Exam Triage Vital Signs ED Triage Vitals  Encounter Vitals Group     BP 05/14/24 0857 132/85     Systolic BP Percentile --      Diastolic BP Percentile --      Pulse Rate 05/14/24 0857 71     Resp 05/14/24 0857 14     Temp 05/14/24 0857 98.2 F (36.8 C)     Temp Source 05/14/24 0857 Oral     SpO2 05/14/24 0857 98 %     Weight --      Height --      Head Circumference --      Peak Flow --  Pain Score 05/14/24 0903 0     Pain Loc --      Pain Education --      Exclude from Growth Chart --    No data found.  Updated Vital Signs BP 132/85 (BP Location: Left Arm)   Pulse 71   Temp 98.2 F (36.8 C) (Oral)   Resp 14   SpO2 98%   Physical Exam Vitals and nursing note reviewed.  Constitutional:      General: She is not in acute distress.    Appearance: Normal appearance. She is not ill-appearing.  HENT:     Right Ear: Tympanic membrane and ear canal normal.     Left Ear: Tympanic membrane and ear canal normal.     Nose: No rhinorrhea.     Mouth/Throat:     Pharynx: Oropharynx is clear.  Cardiovascular:     Rate and Rhythm: Normal rate and regular rhythm.     Pulses: Normal pulses.     Heart sounds: Normal heart sounds.  Pulmonary:     Effort: Pulmonary effort is normal.     Breath sounds: Normal breath sounds.     Comments: Lungs are clear throughout Abdominal:     Palpations: Abdomen is soft.  Musculoskeletal:     Cervical back: Normal range of motion. No rigidity.  Lymphadenopathy:     Cervical: No cervical adenopathy.  Skin:    General: Skin is warm and dry.   Neurological:     Mental Status: She is alert and oriented to person, place, and time.     UC Treatments / Results  Labs (all labs ordered are listed, but only abnormal results are displayed) Labs Reviewed - No data to display  EKG  Radiology No results found.  Procedures Procedures (including critical care time)  Medications Ordered in UC Medications - No data to display  Initial Impression / Assessment and Plan / UC Course  I have reviewed the triage vital signs and the nursing notes.  Pertinent labs & imaging results that were available during my care of the patient were reviewed by me and considered in my medical decision making (see chart for details).  Afebrile, well-appearing, clear lungs.  No red flags.  Discussed likely viral etiology.  Sent Tessalon, promethazine for cough.  Counseled try Flexeril for body aches, back pain with cough.  Other supportive care.  Advised reasons to return to clinic.  Patient is agreeable to plan, no questions  Final Clinical Impressions(s) / UC Diagnoses   Final diagnoses:  Viral URI with cough     Discharge Instructions      The tessalon cough pills can be taken 3 times daily. The promethazine DM cough syrup can be used up to 4 times daily. If this medication makes you drowsy, take only once before bed. You can take the muscle relaxer Flexeril twice daily. This can help with chest and back discomfort. Please be cautious as this medicine may make you drowsy as well. You can use tylenol  and/or ibuprofen  for other pain Drink LOTS of water It may take up to a week for improvement of symptoms   Las pastillas para la tos Tessalon se pueden tomar 3 veces al da. El jarabe para la tos Prometazina DM se puede usar hasta 4 veces al da. Si este medicamento le produce somnolencia, tmelo solo una vez antes de [place]. Puede tomar Teaching laboratory technician Flexeril dos veces al da. Esto puede ayudar con las [employer] pecho y la  espalda. Tenga cuidado, ya que este medicamento tambin puede causar somnolencia. Puede usar Tylenol  o ibuprofeno para otros dolores. Beba mucha agua. Los sntomas pueden tardar Jannetta Men semana en mejorar.  ED Prescriptions     Medication Sig Dispense Auth. Provider   benzonatate (TESSALON) 100 MG capsule Take 1 capsule (100 mg total) by mouth 3 (three) times daily as needed for cough. 30 capsule Kahlee Metivier, PA-C   cyclobenzaprine (FLEXERIL) 10 MG tablet Take 1 tablet (10 mg total) by mouth 2 (two) times daily as needed for muscle spasms. 20 tablet Jaton Eilers, PA-C   promethazine-dextromethorphan (PROMETHAZINE-DM) 6.25-15 MG/5ML syrup Take 5 mLs by mouth 4 (four) times daily as needed for cough. 240 mL Amauria Younts, Ivette Marks, PA-C      PDMP not reviewed this encounter.   Creighton Doffing, [place] 05/14/24 1109

## 2024-05-14 NOTE — ED Triage Notes (Signed)
 Per interpreter-Oscar- #[id number]  Patient reports that she has had a productive cough with white and green sputum and nasal congestion x 1 week. Patient states for the past 2 days she has had body aches and chest and back pain when she coughs.   Patient treports that shehas taken OTC medication for cough and multi-systems.

## 2024-06-16 ENCOUNTER — Ambulatory Visit
Admission: RE | Admit: 2024-06-16 | Discharge: 2024-06-16 | Disposition: A | Discharge status: Home / Self Care | Source: Ambulatory Visit | Attending: Family Medicine | Admitting: Family Medicine

## 2024-06-16 ENCOUNTER — Encounter: Payer: Self-pay | Admitting: Family Medicine

## 2024-06-16 ENCOUNTER — Ambulatory Visit (INDEPENDENT_AMBULATORY_CARE_PROVIDER_SITE_OTHER): Admitting: Family Medicine

## 2024-06-16 VITALS — BP 124/86 | Wt 175.0 lb

## 2024-06-16 DIAGNOSIS — M5412 Radiculopathy, cervical region: Secondary | ICD-10-CM | POA: Diagnosis present

## 2024-06-16 MED ORDER — MELOXICAM 15 MG PO TABS: 15.0000 mg | ORAL_TABLET | Freq: Every day | ORAL | 2 refills | Status: AC

## 2024-06-16 NOTE — Progress Notes (Unsigned)
 DATE OF VISIT: 06/16/2024        Jade Potts DOB: 21-Dec-1975 MRN: [id number]  CC:  Rt shoulder & finger numbness  History- Jade Potts is a 48 y.o. RT-hand dominant female for evaluation and treatment of Rt shoulder and finger numbness Spanish-speaking, visit completed with in person interpreter, Alexander today  PMH significant for diabetes  Referred by PCP for sports med eval Having pain in the right shoulder and into the right hand Associated numbness/tingling along the hand, but primarily fingers 3-5 on the right Worse with lifting the hand, improves with lower the hand Sometimes feels weak when turning over husband - Husband paraplegic - does a lot of heavy lifting for him - has not been dropping anything Has tried Tylenol  prn PCP also tried an injection in the shoulder - that was about 2 months ago - was helpful; unsure what medication was given No neck pain No prior imaging No prior PT  Job: works in [employer], 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 No pertinent surgical history.  Medications Current Outpatient Medications  Medication Sig Dispense Refill   meloxicam  (MOBIC ) 15 MG tablet Take 1 tablet (15 mg total) by mouth daily. 30 tablet 2   benzonatate  (TESSALON ) 100 MG capsule Take 1 capsule (100 mg total) by mouth 3 (three) times daily as needed for cough. 30 capsule 0   cyclobenzaprine  (FLEXERIL ) 10 MG tablet Take 1 tablet (10 mg total) by mouth 2 (two) times daily as needed for muscle spasms. 20 tablet 0   metFORMIN (GLUCOPHAGE) 500 MG tablet Take by mouth 2 (two) times daily with a meal.     promethazine -dextromethorphan  (PROMETHAZINE -DM) 6.25-15 MG/5ML syrup Take 5 mLs by mouth 4 (four) times daily as needed for cough. 240 mL 0   No current facility-administered medications for this visit.    Allergies is allergic to codeine and penicillins.  Family History - reviewed per EMR and intake form  Social History   reports no history of alcohol use.  reports that she has never smoked. She has never used smokeless tobacco.  reports no history of drug use.   EXAM: Vitals: BP 124/86   Wt 175 lb (79.4 kg)   BMI 34.18 kg/m  General: AOx3, NAD, pleasant SKIN: no rashes or lesions, skin clean, dry, intact MSK: C-spine: Full range of motion without pain.  No midline tenderness.  Mild right-sided paraspinal tenderness extending into the right trapezius.  Negative Spurling's bilaterally Shoulder: Bilateral shoulders full range of motion without pain, no newness over the [facility] joint, bicipital groove, greater tuberosity bilaterally.  Negative Hawkins, negative Neer, negative empty can, negative speeds, negative drop arm bilaterally.  Rotator cuff strength 5/5 throughout bilaterally Elbow: Bilateral elbow with full range of motion without pain.  No tenderness along the medial or lateral epicondyle.  No tenderness over the olecranon. Hand/wrist: Right hand/wrist without any gross deformity.  Decreased sensation to light touch along the third, fourth, fifth fingers.  Normal grip strength.  Normal sensation on the left.  No visible atrophy bilaterally.  NEURO: Decreased sensation to light touch along the right third, fourth, fifth  fingers, otherwise sensation intact to light touch upper extremity bilaterally, DTR + 2/4 bicep, tricep, brachioradialis bilaterally.  Mildly positive Tinel's at the right cubital tunnel. VASC: pulses 2+ and symmetric radial artery bilaterally, no edema   Assessment & Plan Right cervical radiculopathy Right arm discomfort and associated numbness and tingling around the shoulder, but also into  the hand.  Suspect underlying cervical radiculopathy, but may also have components of cubital tunnel syndrome  Plan: - Imaging: Will obtain x-rays of the right shoulder, and cervical spine to rule out bony abnormality - Would benefit from physical therapy.  Referral to [facility] PT was given - Rx meloxicam  15 mg 1 tab p.o. daily as needed with food.  Should not take with any other NSAIDs - Heat or ice as needed - Follow-up 6 weeks for reevaluation, sooner as needed.  If no improvement consider MRI for possible EMG/nerve conduction study  Visit completed with in person Spanish interpreter today  Patient expressed understanding & agreement with above.  Encounter Diagnosis  Name Primary?   Right cervical radiculopathy Yes    Orders Placed This Encounter  Procedures   DG Cervical Spine 2 or 3 views   DG Shoulder Right   Ambulatory referral to Physical Therapy    Orders Placed This Encounter  Procedures   DG Cervical Spine 2 or 3 views   DG Shoulder Right   Ambulatory referral to Physical Therapy

## 2024-06-16 NOTE — Patient Instructions (Signed)
 You likely have cervical radiculopathy (a pinched nerve in the neck) that is causing the pain in your shoulder and the numbness into your hand.  We sent a prescription for Meloxicam  (Mobic ) 15mg  1 tab by mouth with food for pain and inflammation.  This is similar to ibuprofen , so do not take with any other NSAIDs (ibuprofen , motrin , advil , aleve, naproxen, etc)  We placed an order to get xrays of your neck and shoulder to check for arthritis that can cause a pinched nerve.  We placed a referral to Physical Therapy to help with your symptoms.  They will call you within 1-2 weeks to schedule.  You can apply heat 15 minutes at a time 3-4 times a day to help with pain and muscle tightness  Watch head position when on computers, texting, when sleeping in bed - should in line with back to prevent further nerve traction and irritation.  Follow up with me in 6-8 weeks.  If not improving we will consider an MRI or possible nerve conduction study (EMG).

## 2024-06-18 ENCOUNTER — Ambulatory Visit: Payer: Self-pay | Admitting: Family Medicine

## 2024-06-18 NOTE — Progress Notes (Signed)
 Spoke to patient on phone with assistance of [employer] Interpretor [place] [phone]).  Reviewed xray results of neck and shoulder.  Advised she should continue with M[facility]  and PT as discussed.  She can follow-up in 6-weeks to reassess.  She expressed understanding and agreement.

## 2024-06-26 ENCOUNTER — Other Ambulatory Visit: Payer: Self-pay

## 2024-06-26 ENCOUNTER — Ambulatory Visit: Attending: Family Medicine

## 2024-06-26 DIAGNOSIS — G8929 Other chronic pain: Secondary | ICD-10-CM | POA: Insufficient documentation

## 2024-06-26 DIAGNOSIS — M25511 Pain in right shoulder: Secondary | ICD-10-CM | POA: Insufficient documentation

## 2024-06-26 DIAGNOSIS — M5412 Radiculopathy, cervical region: Secondary | ICD-10-CM | POA: Diagnosis present

## 2024-06-26 NOTE — Therapy (Signed)
 OUTPATIENT PHYSICAL THERAPY CERVICAL EVALUATION    Patient Name: Jade Potts MRN: [id number] DOB:1976/09/17, 48 y.o., female Today's Date: 06/26/2024  END OF SESSION:  PT End of Session - 06/26/24 0924     Visit Number 1    Number of Visits 9    Date for PT Re-Evaluation 08/21/24    Authorization Type [employer]    PT Start Time 0925    PT Stop Time 1003    PT Time Calculation (min) 38 min    Activity Tolerance Patient tolerated treatment well    Behavior During Therapy [facility] for tasks assessed/performed          Past Medical History:  Diagnosis Date   Anemia    1st pregnancy   Gestational diabetes    1st and 2nd pregnancy   Hypertension    Insertion of Nexplanon  02/21/2012   02/21/2012   No pertinent past medical history    History reviewed. No pertinent surgical history. Patient Active Problem List   Diagnosis Date Noted   Insertion of Nexplanon  02/21/2012    PCP: Jade Rosaline SQUIBB, NP  REFERRING PROVIDER: Teressa Rainell BROCKS, DO  REFERRING DIAG: Right cervical radiculopathy [M54.12]  Evaluate and treat for cervical radiculopathy and possible cubital tunnel syndrome.   THERAPY DIAG:  Radiculopathy, cervical region  Chronic right shoulder pain  Rationale for Evaluation and Treatment: Rehabilitation  ONSET DATE: 2 years   SUBJECTIVE:                                                                                                                                                                                                         SUBJECTIVE STATEMENT: Patient reports to PT with neck and R UE pain that has been present for about 2 years. She states that most of the pain is in her shoulder. She doesn't recall any injuries.She reports that she also cares for her husband who is a paraplegic. She is the only one who can do the lifting to help him with transfers and mobility. So she states that even the painful/aggravating activities, she has to just do  anyway. She does also note numbness in R hand that is present most of the time. She did have an injection a few months ago and it helps her enough.   Hand dominance: Right  PERTINENT HISTORY:  Relevant PMHx includes HTN  PAIN:  Are you having pain? Yes: NPRS scale: 0/10 current, 6/10 at worst  Pain location: R shoulder/UE Pain description: strong, squeezing, fingers are numbness  Aggravating factors: lifting, working,  resting arm in her lap Relieving factors: tylenol     PRECAUTIONS: None  RED FLAGS: None     WEIGHT BEARING RESTRICTIONS: No  FALLS:  Has patient fallen in last 6 months? No  LIVING ENVIRONMENT: Lives with: lives with their family and lives with their spouse Lives in: House/apartment Stairs: No Has following equipment at home: None  OCCUPATION: packaging   PLOF: Independent  PATIENT GOALS: Not having any pain and the numbness subsides.   NEXT MD VISIT: 07/28/2024 with referring provider  OBJECTIVE:  Note: Objective measures were completed at Evaluation unless otherwise noted.  DIAGNOSTIC FINDINGS:  06/16/24 Cervical Spine X-Ray  FINDINGS: Straightening of normal cervical lordosis likely related to patient positioning or muscle spasm. Mild endplate spurring noted at C4-C5, C5-C6, C6-C7. Prevertebral soft tissues are normal thickness. Visualized lung apices are clear.   IMPRESSION: Mild multilevel degenerative changes of the cervical spine.  PATIENT SURVEYS:  NDI: 9/50   COGNITION: Overall cognitive status: Within functional limits for tasks assessed  SENSATION: Not tested  POSTURE: rounded shoulders and forward head  PALPATION: Moderate tenderness to palpation along R UT, supraspinatus    CERVICAL ROM:   Passive ROM PROM (deg) eval  Flexion   Extension   Right lateral flexion   Left lateral flexion   Right rotation   Left rotation    (Blank rows = not tested)  UPPER EXTREMITY ROM:  Active ROM Right eval Left eval  Shoulder  flexion    Shoulder extension    Shoulder abduction    Shoulder adduction    Shoulder extension    Shoulder internal rotation    Shoulder external rotation    Elbow flexion    Elbow extension    Wrist flexion    Wrist extension    Wrist ulnar deviation    Wrist radial deviation    Wrist pronation    Wrist supination     (Blank rows = not tested)  UPPER EXTREMITY MMT:  MMT Right eval Left eval  Shoulder flexion 5 5  Shoulder extension    Shoulder abduction 4+ 4+  Shoulder adduction    Shoulder extension    Shoulder internal rotation 4+ 5  Shoulder external rotation 4+ 5  Middle trapezius    Lower trapezius    Elbow flexion 5 5  Elbow extension 5 5  Wrist flexion    Wrist extension    Wrist ulnar deviation    Wrist radial deviation    Wrist pronation    Wrist supination    Grip strength 45lb 47lb   (Blank rows = not tested)  CERVICAL SPECIAL TESTS:  Spurling's test: Negative; reports decreased shoulder pain Shoulder Abduction test: Positive, relief of symptoms  Patient also report relief of symptoms with elbow flexion vs extended elbow when resting arm in lap; additional pain relief with PA GHJ mob with >60 degrees of shoulder abduction while laying supine     TREATMENT DATE:   [facility] Adult PT Treatment:                                                DATE: 06/26/2024   Initial evaluation: see patient education and home exercise program as noted below  PATIENT EDUCATION:  Education details: reviewed initial home exercise program; discussion of POC, prognosis and goals for skilled PT   Person educated: Patient Education method: Explanation, Demonstration, and Handouts Education comprehension: verbalized understanding, returned demonstration, and needs further education  HOME EXERCISE PROGRAM: Access Code: [email or web address] URL:  [email or web address] Date: 06/26/2024 Prepared by: Jade Potts  Exercises - Radial Nerve Flossing  - 2 x daily - 7 x weekly - 2 sets - 10 reps - Seated Cervical Retraction  - 2 x daily - 7 x weekly - 2 sets - 10 reps - 3 sec hold - Shoulder Abduction with Dumbbells - Thumbs Up  - 2 x daily - 7 x weekly - 2 sets - 10 reps  ASSESSMENT:  CLINICAL IMPRESSION: Jade Potts is a 48 y.o. female who was seen today for physical therapy evaluation and treatment for Neck Pain with Radicular Pain. She is demonstrating decreased R>L UE MMT, decreased postural endurance, positive shoulder abduction test, and negative Spurling's test. She has related pain and difficulty with heavy lifting including caregiving responsibilities, occupational responsibilities, and resting her am in her lap. She requires skilled PT services at this time to address relevant deficits and improve overall function.     OBJECTIVE IMPAIRMENTS: decreased activity tolerance, decreased endurance, decreased ROM, decreased strength, impaired UE functional use, improper body mechanics, postural dysfunction, and pain.   ACTIVITY LIMITATIONS: carrying, lifting, sleeping, and caring for others  PARTICIPATION LIMITATIONS: meal prep, cleaning, laundry, interpersonal relationship, driving, shopping, community activity, and occupation  PERSONAL FACTORS: Past/current experiences, Profession, Time since onset of injury/illness/exacerbation, 1 comorbidity: HTN, and Caregiving Responsibilities are also affecting patient's functional outcome.   REHAB POTENTIAL: Fair    CLINICAL DECISION MAKING: Stable/uncomplicated  EVALUATION COMPLEXITY: Low   GOALS: Goals reviewed with patient? YES  SHORT TERM GOALS: Target date: 07/24/2024   Patient will be independent with initial home program at least 3 days/week.  Baseline: provided at eval Goal Status: INITIAL   2.  Patient will demonstrate improved postural awareness for at least 15  minutes while seated without need for cueing from PT.  Baseline: see objective measures Goal Status: INITIAL   3.  Patient will demonstrate improved BIL grip strength to at least 50lb bilaterally.  Goal status: INITIAL   LONG TERM GOALS: Target date: 08/21/2024    Patient will report improved overall functional ability with NDI score of 4 or less.  Baseline: 9/50 Goal Status: INITIAL    2.  Patient will demonstrate ability to perform floor to waist lifting of at least 25# using appropriate body mechanics and with no more than minimal pain in order to safely perform normal daily/occupational tasks.   Goal Status: INITIAL   3.  Patient will reporting decreased frequency of distal paresthesia, that is alleviated with HEP and position changes throughout the day, utilizing these as needed.  Baseline: reports distal paresthesia is present >75% of the day  Goal status: INITIAL  4.  Patient will report decreased R shoulder pain to 3/10 or less with daily activities, including care giving tasks for her husband.  Goal status: INITIAL     PLAN:  PT FREQUENCY: 1-2x/week  PT DURATION: 8 weeks  PLANNED INTERVENTIONS: 97164- PT Re-evaluation, 97750- Physical Performance Testing, 97110-Therapeutic exercises, 97530- Therapeutic activity, [id number]- Neuromuscular re-education, 97535- Self Care, [id number]- Manual therapy, [id number]- Electrical stimulation (unattended), Patient/Family education, Taping, Joint mobilization, Spinal manipulation, Spinal mobilization, Cryotherapy, and Moist heat  PLAN FOR NEXT SESSION: cervical retraction, shoulder mobility, neural desensitization, UE strengthening  Jade Potts, PT, DPT  06/26/2024 11:49 AM

## 2024-07-03 ENCOUNTER — Telehealth: Payer: Self-pay

## 2024-07-03 ENCOUNTER — Ambulatory Visit

## 2024-07-03 NOTE — Therapy (Incomplete)
 OUTPATIENT PHYSICAL THERAPY CERVICAL EVALUATION    Patient Name: Jade Potts MRN: [id number] DOB:04-11-1976, 48 y.o., female Today's Date: 07/03/2024  END OF SESSION:    Past Medical History:  Diagnosis Date   Anemia    1st pregnancy   Gestational diabetes    1st and 2nd pregnancy   Hypertension    Insertion of Nexplanon  02/21/2012   02/21/2012   No pertinent past medical history    No past surgical history on file. Patient Active Problem List   Diagnosis Date Noted   Insertion of Nexplanon  02/21/2012    PCP: Celestia Rosaline SQUIBB, NP  REFERRING PROVIDER: Teressa Rainell BROCKS, DO  REFERRING DIAG: Right cervical radiculopathy [M54.12]  Evaluate and treat for cervical radiculopathy and possible cubital tunnel syndrome.   THERAPY DIAG:  No diagnosis found.  Rationale for Evaluation and Treatment: Rehabilitation  ONSET DATE: 2 years   SUBJECTIVE:                                                                                                                                                                                                         SUBJECTIVE STATEMENT:  07/03/2024 ***   EVAL: Patient reports to PT with neck and R UE pain that has been present for about 2 years. She states that most of the pain is in her shoulder. She doesn't recall any injuries.She reports that she also cares for her husband who is a paraplegic. She is the only one who can do the lifting to help him with transfers and mobility. So she states that even the painful/aggravating activities, she has to just do anyway. She does also note numbness in R hand that is present most of the time. She did have an injection a few months ago and it helps her enough.   Hand dominance: Right  PERTINENT HISTORY:  Relevant PMHx includes HTN  PAIN:  Are you having pain? Yes: NPRS scale: 0/10 current, 6/10 at worst  Pain location: R shoulder/UE Pain description: strong, squeezing, fingers are numbness   Aggravating factors: lifting, working, resting arm in her lap Relieving factors: tylenol     PRECAUTIONS: None  RED FLAGS: None     WEIGHT BEARING RESTRICTIONS: No  FALLS:  Has patient fallen in last 6 months? No  LIVING ENVIRONMENT: Lives with: lives with their family and lives with their spouse Lives in: House/apartment Stairs: No Has following equipment at home: None  OCCUPATION: packaging   PLOF: Independent  PATIENT GOALS: Not having any pain and the numbness subsides.   NEXT MD VISIT:  07/28/2024 with referring provider  OBJECTIVE:  Note: Objective measures were completed at Evaluation unless otherwise noted.  DIAGNOSTIC FINDINGS:  06/16/24 Cervical Spine X-Ray  FINDINGS: Straightening of normal cervical lordosis likely related to patient positioning or muscle spasm. Mild endplate spurring noted at C4-C5, C5-C6, C6-C7. Prevertebral soft tissues are normal thickness. Visualized lung apices are clear.   IMPRESSION: Mild multilevel degenerative changes of the cervical spine.  PATIENT SURVEYS:  NDI: 9/50   COGNITION: Overall cognitive status: Within functional limits for tasks assessed  SENSATION: Not tested  POSTURE: rounded shoulders and forward head  PALPATION: Moderate tenderness to palpation along R UT, supraspinatus    CERVICAL ROM:   Passive ROM PROM (deg) eval  Flexion   Extension   Right lateral flexion   Left lateral flexion   Right rotation   Left rotation    (Blank rows = not tested)  UPPER EXTREMITY ROM:  Active ROM Right eval Left eval  Shoulder flexion    Shoulder extension    Shoulder abduction    Shoulder adduction    Shoulder extension    Shoulder internal rotation    Shoulder external rotation    Elbow flexion    Elbow extension    Wrist flexion    Wrist extension    Wrist ulnar deviation    Wrist radial deviation    Wrist pronation    Wrist supination     (Blank rows = not tested)  UPPER EXTREMITY  MMT:  MMT Right eval Left eval  Shoulder flexion 5 5  Shoulder extension    Shoulder abduction 4+ 4+  Shoulder adduction    Shoulder extension    Shoulder internal rotation 4+ 5  Shoulder external rotation 4+ 5  Middle trapezius    Lower trapezius    Elbow flexion 5 5  Elbow extension 5 5  Wrist flexion    Wrist extension    Wrist ulnar deviation    Wrist radial deviation    Wrist pronation    Wrist supination    Grip strength 45lb 47lb   (Blank rows = not tested)  CERVICAL SPECIAL TESTS:  Spurling's test: Negative; reports decreased shoulder pain Shoulder Abduction test: Positive, relief of symptoms  Patient also report relief of symptoms with elbow flexion vs extended elbow when resting arm in lap; additional pain relief with PA GHJ mob with >60 degrees of shoulder abduction while laying supine     TREATMENT DATE:    [facility] Adult PT Treatment:                                                DATE: 07/03/2024  Therapeutic Exercise: Cervical Retraction  Pball rolling  UE strengthening Manual Therapy: *** Neuromuscular re-ed: Nerve Glide ? Cervical retraction  Therapeutic Activity: *** Modalities: *** Self Care: ***   [facility] Adult PT Treatment:                                                DATE: 06/26/2024   Initial evaluation: see patient education and home exercise program as noted below  PATIENT EDUCATION:  Education details: reviewed initial home exercise program; discussion of POC, prognosis and goals for skilled PT   Person educated: Patient Education method: Explanation, Demonstration, and Handouts Education comprehension: verbalized understanding, returned demonstration, and needs further education  HOME EXERCISE PROGRAM: Access Code: [email or web address] URL: [email or web address] Date: 06/26/2024 Prepared by:  Marko Molt  Exercises - Radial Nerve Flossing  - 2 x daily - 7 x weekly - 2 sets - 10 reps - Seated Cervical Retraction  - 2 x daily - 7 x weekly - 2 sets - 10 reps - 3 sec hold - Shoulder Abduction with Dumbbells - Thumbs Up  - 2 x daily - 7 x weekly - 2 sets - 10 reps  ASSESSMENT:  CLINICAL IMPRESSION:  07/03/2024 ***  EVAL: Jade Potts is a 48 y.o. female who was seen today for physical therapy evaluation and treatment for Neck Pain with Radicular Pain. She is demonstrating decreased R>L UE MMT, decreased postural endurance, positive shoulder abduction test, and negative Spurling's test. She has related pain and difficulty with heavy lifting including caregiving responsibilities, occupational responsibilities, and resting her am in her lap. She requires skilled PT services at this time to address relevant deficits and improve overall function.     OBJECTIVE IMPAIRMENTS: decreased activity tolerance, decreased endurance, decreased ROM, decreased strength, impaired UE functional use, improper body mechanics, postural dysfunction, and pain.   ACTIVITY LIMITATIONS: carrying, lifting, sleeping, and caring for others  PARTICIPATION LIMITATIONS: meal prep, cleaning, laundry, interpersonal relationship, driving, shopping, community activity, and occupation  PERSONAL FACTORS: Past/current experiences, Profession, Time since onset of injury/illness/exacerbation, 1 comorbidity: HTN, and Caregiving Responsibilities are also affecting patient's functional outcome.   REHAB POTENTIAL: Fair    CLINICAL DECISION MAKING: Stable/uncomplicated  EVALUATION COMPLEXITY: Low   GOALS: Goals reviewed with patient? YES  SHORT TERM GOALS: Target date: 07/24/2024   Patient will be independent with initial home program at least 3 days/week.  Baseline: provided at eval Goal Status: INITIAL   2.  Patient will demonstrate improved postural awareness for at least 15 minutes while seated without need for  cueing from PT.  Baseline: see objective measures Goal Status: INITIAL   3.  Patient will demonstrate improved BIL grip strength to at least 50lb bilaterally.  Goal status: INITIAL   LONG TERM GOALS: Target date: 08/21/2024    Patient will report improved overall functional ability with NDI score of 4 or less.  Baseline: 9/50 Goal Status: INITIAL    2.  Patient will demonstrate ability to perform floor to waist lifting of at least 25# using appropriate body mechanics and with no more than minimal pain in order to safely perform normal daily/occupational tasks.   Goal Status: INITIAL   3.  Patient will reporting decreased frequency of distal paresthesia, that is alleviated with HEP and position changes throughout the day, utilizing these as needed.  Baseline: reports distal paresthesia is present >75% of the day  Goal status: INITIAL  4.  Patient will report decreased R shoulder pain to 3/10 or less with daily activities, including care giving tasks for her husband.  Goal status: INITIAL     PLAN:  PT FREQUENCY: 1-2x/week  PT DURATION: 8 weeks  PLANNED INTERVENTIONS: 97164- PT Re-evaluation, 97750- Physical Performance Testing, 97110-Therapeutic exercises, 97530- Therapeutic activity, [id number]- Neuromuscular re-education, 97535- Self Care, 02859- Manual therapy, [id number]- Electrical stimulation (unattended), Patient/Family education, Taping, Joint mobilization, Spinal manipulation, Spinal mobilization, Cryotherapy, and Moist heat  PLAN FOR NEXT SESSION: cervical retraction, shoulder mobility, neural  desensitization, UE strengthening    Marko Molt, PT, DPT  07/03/2024 8:17 AM

## 2024-07-03 NOTE — Telephone Encounter (Signed)
 Called via [employer] interpreter [phone]. Patient states that she forgot about the appointment. Confirmed next appt. - MJ

## 2024-07-09 NOTE — Therapy (Signed)
 OUTPATIENT PHYSICAL THERAPY TREATMENT   Patient Name: Jade Potts MRN: [id number] DOB:1976/10/22, 48 y.o., female Today's Date: 07/10/2024  END OF SESSION:  PT End of Session - 07/10/24 0910     Visit Number 2    Number of Visits 9    Date for PT Re-Evaluation 08/21/24    Authorization Type [employer]    PT Start Time 0912    PT Stop Time 0954    PT Time Calculation (min) 42 min    Activity Tolerance Patient tolerated treatment well           Past Medical History:  Diagnosis Date   Anemia    1st pregnancy   Gestational diabetes    1st and 2nd pregnancy   Hypertension    Insertion of Nexplanon  02/21/2012   02/21/2012   No pertinent past medical history    History reviewed. No pertinent surgical history. Patient Active Problem List   Diagnosis Date Noted   Insertion of Nexplanon  02/21/2012    PCP: Celestia Rosaline SQUIBB, NP  REFERRING PROVIDER: Teressa Rainell BROCKS, DO  REFERRING DIAG: Right cervical radiculopathy [M54.12]  Evaluate and treat for cervical radiculopathy and possible cubital tunnel syndrome.   THERAPY DIAG:  Radiculopathy, cervical region  Chronic right shoulder pain  Rationale for Evaluation and Treatment: Rehabilitation  ONSET DATE: 2 years   SUBJECTIVE:                                                                                                                                                                                                        Per eval: Patient reports to PT with neck and R UE pain that has been present for about 2 years. She states that most of the pain is in her shoulder. She doesn't recall any injuries.She reports that she also cares for her husband who is a paraplegic. She is the only one who can do the lifting to help him with transfers and mobility. So she states that even the painful/aggravating activities, she has to just do anyway. She does also note numbness in R hand that is present most of the time. She did  have an injection a few months ago and it helps her enough.   Hand dominance: Right  SUBJECTIVE STATEMENT: 07/10/2024: states she is feeling okay today. States she felt about the same after eval, hasn't had much time to try HEP. Still having numbness in her arm. No pain at present, is having numbness 6/10 in middle digits. Variable. States pain has been much better since injection. Appreciate  assistance of in person interpreter.  PERTINENT HISTORY:  Relevant PMHx includes HTN  PAIN:  Are you having pain? 07/10/2024 see subjective above  Per eval:  Yes: NPRS scale: 0/10 current, 6/10 at worst  Pain location: R shoulder/UE Pain description: strong, squeezing, fingers are numbness  Aggravating factors: lifting, working, resting arm in her lap Relieving factors: tylenol     PRECAUTIONS: None  RED FLAGS: None     WEIGHT BEARING RESTRICTIONS: No  FALLS:  Has patient fallen in last 6 months? No  LIVING ENVIRONMENT: Lives with: lives with their family and lives with their spouse Lives in: House/apartment Stairs: No Has following equipment at home: None  OCCUPATION: packaging   PLOF: Independent  PATIENT GOALS: Not having any pain and the numbness subsides.   NEXT MD VISIT: 07/28/2024 with referring provider  OBJECTIVE:  Note: Objective measures were completed at Evaluation unless otherwise noted.  DIAGNOSTIC FINDINGS:  06/16/24 Cervical Spine X-Ray  FINDINGS: Straightening of normal cervical lordosis likely related to patient positioning or muscle spasm. Mild endplate spurring noted at C4-C5, C5-C6, C6-C7. Prevertebral soft tissues are normal thickness. Visualized lung apices are clear.   IMPRESSION: Mild multilevel degenerative changes of the cervical spine.  PATIENT SURVEYS:  NDI: 9/50   COGNITION: Overall cognitive status: Within functional limits for tasks assessed  SENSATION: Not tested  POSTURE: rounded shoulders and forward head  PALPATION: Moderate  tenderness to palpation along R UT, supraspinatus    CERVICAL ROM:   Passive ROM PROM (deg) eval  Flexion   Extension   Right lateral flexion   Left lateral flexion   Right rotation   Left rotation    (Blank rows = not tested)  UPPER EXTREMITY ROM:  Active ROM Right eval Left eval  Shoulder flexion    Shoulder extension    Shoulder abduction    Shoulder adduction    Shoulder extension    Shoulder internal rotation    Shoulder external rotation    Elbow flexion    Elbow extension    Wrist flexion    Wrist extension    Wrist ulnar deviation    Wrist radial deviation    Wrist pronation    Wrist supination     (Blank rows = not tested)  UPPER EXTREMITY MMT:  MMT Right eval Left eval  Shoulder flexion 5 5  Shoulder extension    Shoulder abduction 4+ 4+  Shoulder adduction    Shoulder extension    Shoulder internal rotation 4+ 5  Shoulder external rotation 4+ 5  Middle trapezius    Lower trapezius    Elbow flexion 5 5  Elbow extension 5 5  Wrist flexion    Wrist extension    Wrist ulnar deviation    Wrist radial deviation    Wrist pronation    Wrist supination    Grip strength 45lb 47lb   (Blank rows = not tested)  CERVICAL SPECIAL TESTS:  Spurling's test: Negative; reports decreased shoulder pain Shoulder Abduction test: Positive, relief of symptoms  Patient also report relief of symptoms with elbow flexion vs extended elbow when resting arm in lap; additional pain relief with PA GHJ mob with >60 degrees of shoulder abduction while laying supine     TREATMENT DATE:   [facility] Adult PT Treatment:  DATE: 07/10/24  Neuromuscular re-ed: Radial nerve glides 2x10 w/ education/cueing to maximize tolerance Seated chin tucks x12 emphasis on motor control and reduced compensations Red band 3 way horizontal abd pulses x5 each cues for posture and control  Modified plank, counter, shoulder taps x8 BIL  Self  Care: Education/discussion re: symptom behavior, relevant anatomy/physiology, rationale for interventions, strategy to modify symptoms                                                                                                                                  PATIENT EDUCATION:  Education details: rationale for interventions, HEP  Person educated: Patient Education method: Explanation, Demonstration, Tactile cues, Verbal cues Education comprehension: verbalized understanding, returned demonstration, verbal cues required, tactile cues required, and needs further education     HOME EXERCISE PROGRAM: Access Code: [facility] URL: [email or web address] Date: 06/26/2024 Prepared by: Marko Molt  Exercises - Radial Nerve Flossing  - 2 x daily - 7 x weekly - 2 sets - 10 reps - Seated Cervical Retraction  - 2 x daily - 7 x weekly - 2 sets - 10 reps - 3 sec hold - Shoulder Abduction with Dumbbells - Thumbs Up  - 2 x daily - 7 x weekly - 2 sets - 10 reps  ASSESSMENT:  CLINICAL IMPRESSION: 07/10/2024: Pt arrives w/ numbness 6/10 rather than pain, no changes since eveal. Much of session spent working on education to maximize self efficacy w/ management of nerve glides - initially irritating, but on second set w/ education nearly resolves numbness. Symptoms remain somewhat variable throughout session but overall tolerates well. No adverse events, reports continued numbness and shoulder fatigue on departure. Best response today to modified radial nerve glides and chin tucks. Recommend continuing along current POC in order to address relevant deficits and improve functional tolerance.Pt departs today's session in no acute distress, all voiced questions/concerns addressed appropriately from PT perspective.    Per eval: Jade Potts is a 48 y.o. female who was seen today for physical therapy evaluation and treatment for Neck Pain with Radicular Pain. She is demonstrating decreased R>L UE MMT,  decreased postural endurance, positive shoulder abduction test, and negative Spurling's test. She has related pain and difficulty with heavy lifting including caregiving responsibilities, occupational responsibilities, and resting her am in her lap. She requires skilled PT services at this time to address relevant deficits and improve overall function.     OBJECTIVE IMPAIRMENTS: decreased activity tolerance, decreased endurance, decreased ROM, decreased strength, impaired UE functional use, improper body mechanics, postural dysfunction, and pain.   ACTIVITY LIMITATIONS: carrying, lifting, sleeping, and caring for others  PARTICIPATION LIMITATIONS: meal prep, cleaning, laundry, interpersonal relationship, driving, shopping, community activity, and occupation  PERSONAL FACTORS: Past/current experiences, Profession, Time since onset of injury/illness/exacerbation, 1 comorbidity: HTN, and Caregiving Responsibilities are also affecting patient's functional outcome.   REHAB POTENTIAL: Fair    CLINICAL DECISION MAKING: Stable/uncomplicated  EVALUATION COMPLEXITY: Low  GOALS: Goals reviewed with patient? YES  SHORT TERM GOALS: Target date: 07/24/2024   Patient will be independent with initial home program at least 3 days/week.  Baseline: provided at eval Goal Status: INITIAL   2.  Patient will demonstrate improved postural awareness for at least 15 minutes while seated without need for cueing from PT.  Baseline: see objective measures Goal Status: INITIAL   3.  Patient will demonstrate improved BIL grip strength to at least 50lb bilaterally.  Goal status: INITIAL   LONG TERM GOALS: Target date: 08/21/2024    Patient will report improved overall functional ability with NDI score of 4 or less.  Baseline: 9/50 Goal Status: INITIAL    2.  Patient will demonstrate ability to perform floor to waist lifting of at least 25# using appropriate body mechanics and with no more than minimal  pain in order to safely perform normal daily/occupational tasks.   Goal Status: INITIAL   3.  Patient will reporting decreased frequency of distal paresthesia, that is alleviated with HEP and position changes throughout the day, utilizing these as needed.  Baseline: reports distal paresthesia is present >75% of the day  Goal status: INITIAL  4.  Patient will report decreased R shoulder pain to 3/10 or less with daily activities, including care giving tasks for her husband.  Goal status: INITIAL     PLAN:  PT FREQUENCY: 1-2x/week  PT DURATION: 8 weeks  PLANNED INTERVENTIONS: 97164- PT Re-evaluation, 97750- Physical Performance Testing, 97110-Therapeutic exercises, 97530- Therapeutic activity, [id number]- Neuromuscular re-education, 97535- Self Care, [id number]- Manual therapy, [id number]- Electrical stimulation (unattended), Patient/Family education, Taping, Joint mobilization, Spinal manipulation, Spinal mobilization, Cryotherapy, and Moist heat  PLAN FOR NEXT SESSION: cervical retraction, shoulder mobility, neural desensitization, UE strengthening    Alm DELENA Jenny PT, DPT 07/10/2024 10:00 AM

## 2024-07-10 ENCOUNTER — Ambulatory Visit: Attending: Family Medicine | Admitting: Physical Therapy

## 2024-07-10 ENCOUNTER — Encounter: Payer: Self-pay | Admitting: Physical Therapy

## 2024-07-10 DIAGNOSIS — M25511 Pain in right shoulder: Secondary | ICD-10-CM | POA: Diagnosis present

## 2024-07-10 DIAGNOSIS — M5412 Radiculopathy, cervical region: Secondary | ICD-10-CM | POA: Insufficient documentation

## 2024-07-10 DIAGNOSIS — G8929 Other chronic pain: Secondary | ICD-10-CM | POA: Diagnosis present

## 2024-07-17 ENCOUNTER — Ambulatory Visit

## 2024-07-17 DIAGNOSIS — M5412 Radiculopathy, cervical region: Secondary | ICD-10-CM | POA: Diagnosis not present

## 2024-07-17 DIAGNOSIS — G8929 Other chronic pain: Secondary | ICD-10-CM

## 2024-07-17 NOTE — Therapy (Signed)
 OUTPATIENT PHYSICAL THERAPY TREATMENT   Patient Name: Jade Potts MRN: [id number] DOB:[birth year], 48 y.o., female Today's Date: 07/17/2024  END OF SESSION:  PT End of Session - 07/17/24 0832     Visit Number 3    Number of Visits 9    Date for PT Re-Evaluation 08/21/24    Authorization Type [employer]    PT Start Time 0830    PT Stop Time 0910    PT Time Calculation (min) 40 min    Activity Tolerance Patient tolerated treatment well    Behavior During Therapy [facility] for tasks assessed/performed            Past Medical History:  Diagnosis Date   Anemia    1st pregnancy   Gestational diabetes    1st and 2nd pregnancy   Hypertension    Insertion of Nexplanon  02/21/2012   02/21/2012   No pertinent past medical history    No past surgical history on file. Patient Active Problem List   Diagnosis Date Noted   Insertion of Nexplanon  02/21/2012    PCP: Celestia Rosaline SQUIBB, NP  REFERRING PROVIDER: Teressa Rainell BROCKS, DO  REFERRING DIAG: Right cervical radiculopathy [M54.12]  Evaluate and treat for cervical radiculopathy and possible cubital tunnel syndrome.   THERAPY DIAG:  Radiculopathy, cervical region  Chronic right shoulder pain  Rationale for Evaluation and Treatment: Rehabilitation  ONSET DATE: 2 years   SUBJECTIVE:                                                                                                                                                                                                        Per eval: Patient reports to PT with neck and R UE pain that has been present for about 2 years. She states that most of the pain is in her shoulder. She doesn't recall any injuries.She reports that she also cares for her husband who is a paraplegic. She is the only one who can do the lifting to help him with transfers and mobility. So she states that even the painful/aggravating activities, she has to just do anyway. She does also note numbness in R  hand that is present most of the time. She did have an injection a few months ago and it helps her enough.   Hand dominance: Right  SUBJECTIVE STATEMENT: 07/17/2024: Patient reports that some days her pain is doing better and some days it is the same. She is having a hard time incorporating her HEP at home d/t spending most of the day running errands and  caring for her husband.   PERTINENT HISTORY:  Relevant PMHx includes HTN  PAIN:  Are you having pain? 07/17/2024 see subjective above  Per eval:  Yes: NPRS scale: 0/10 current, 6/10 at worst  Pain location: R shoulder/UE Pain description: strong, squeezing, fingers are numbness  Aggravating factors: lifting, working, resting arm in her lap Relieving factors: tylenol     PRECAUTIONS: None  RED FLAGS: None     WEIGHT BEARING RESTRICTIONS: No  FALLS:  Has patient fallen in last 6 months? No  LIVING ENVIRONMENT: Lives with: lives with their family and lives with their spouse Lives in: House/apartment Stairs: No Has following equipment at home: None  OCCUPATION: packaging   PLOF: Independent  PATIENT GOALS: Not having any pain and the numbness subsides.   NEXT MD VISIT: 07/28/2024 with referring provider  OBJECTIVE:  Note: Objective measures were completed at Evaluation unless otherwise noted.  DIAGNOSTIC FINDINGS:  06/16/24 Cervical Spine X-Ray  FINDINGS: Straightening of normal cervical lordosis likely related to patient positioning or muscle spasm. Mild endplate spurring noted at C4-C5, C5-C6, C6-C7. Prevertebral soft tissues are normal thickness. Visualized lung apices are clear.   IMPRESSION: Mild multilevel degenerative changes of the cervical spine.  PATIENT SURVEYS:  NDI: 9/50   COGNITION: Overall cognitive status: Within functional limits for tasks assessed  SENSATION: Not tested  POSTURE: rounded shoulders and forward head  PALPATION: Moderate tenderness to palpation along R UT, supraspinatus     CERVICAL ROM:   Passive ROM PROM (deg) eval  Flexion   Extension   Right lateral flexion   Left lateral flexion   Right rotation   Left rotation    (Blank rows = not tested)  UPPER EXTREMITY ROM:  Active ROM Right eval Left eval  Shoulder flexion    Shoulder extension    Shoulder abduction    Shoulder adduction    Shoulder extension    Shoulder internal rotation    Shoulder external rotation    Elbow flexion    Elbow extension    Wrist flexion    Wrist extension    Wrist ulnar deviation    Wrist radial deviation    Wrist pronation    Wrist supination     (Blank rows = not tested)  UPPER EXTREMITY MMT:  MMT Right eval Left eval  Shoulder flexion 5 5  Shoulder extension    Shoulder abduction 4+ 4+  Shoulder adduction    Shoulder extension    Shoulder internal rotation 4+ 5  Shoulder external rotation 4+ 5  Middle trapezius    Lower trapezius    Elbow flexion 5 5  Elbow extension 5 5  Wrist flexion    Wrist extension    Wrist ulnar deviation    Wrist radial deviation    Wrist pronation    Wrist supination    Grip strength 45lb 47lb   (Blank rows = not tested)  CERVICAL SPECIAL TESTS:  Spurling's test: Negative; reports decreased shoulder pain Shoulder Abduction test: Positive, relief of symptoms  Patient also report relief of symptoms with elbow flexion vs extended elbow when resting arm in lap; additional pain relief with PA GHJ mob with >60 degrees of shoulder abduction while laying supine     TREATMENT DATE:   [facility] Adult PT Treatment:  DATE: 07/17/2024  Neuromuscular re-ed: Seated chin tucks 2x10 emphasis on motor control and reduced compensations Red band 3 way horizontal abd pulses 2x5 each cues for posture and control  Seated BIL shoulder ER red band 2 x 5  Modified plank, counter, shoulder taps x8 BIL Standing rows  x19 Standing shoulder extension BIL x 10    Self-Care:  Patient  education regarding strategies for HEP compliance/activity pacing throughout the day  Updated and reviewed HEP to focus on 3-4 activities max                                                                                                                   [facility] Adult PT Treatment:                                                DATE: 07/10/24  Neuromuscular re-ed: Radial nerve glides 2x10 w/ education/cueing to maximize tolerance Seated chin tucks x12 emphasis on motor control and reduced compensations Red band 3 way horizontal abd pulses x5 each cues for posture and control  Modified plank, counter, shoulder taps x8 BIL  Self Care: Education/discussion re: symptom behavior, relevant anatomy/physiology, rationale for interventions, strategy to modify symptoms                                                                                                                                  PATIENT EDUCATION:  Education details: rationale for interventions, HEP  Person educated: Patient Education method: Explanation, Demonstration, Tactile cues, Verbal cues Education comprehension: verbalized understanding, returned demonstration, verbal cues required, tactile cues required, and needs further education     HOME EXERCISE PROGRAM: Access Code: [facility] URL: [email or web address] Date: 07/17/2024 Prepared by: Marko Molt  Exercises - Radial Nerve Flossing  - 1 x daily - 7 x weekly - 2 sets - 10 reps - Seated Cervical Retraction  - 1 x daily - 7 x weekly - 2 sets - 10 reps - 3 sec hold - Standing Shoulder Horizontal Abduction with Resistance  - 1 x daily - 7 x weekly - 3 sets - 5 reps - Standing Shoulder Diagonal Horizontal Abduction 60/120 Degrees with Resistance  - 1 x daily - 7 x weekly - 3 sets - 5 reps  ASSESSMENT:  CLINICAL IMPRESSION: 07/17/2024: Hadassah MATSU had good tolerance  of today's treatment session. She has increased symptoms with activities requiring neck  extension. She has low motivation with some exercises, however she agrees to the 4 selected exercises for her updated HEP. We will continue to progress per POC as tolerated, in order to reach established rehab goals.    Per eval: Ahley Bulls is a 48 y.o. female who was seen today for physical therapy evaluation and treatment for Neck Pain with Radicular Pain. She is demonstrating decreased R>L UE MMT, decreased postural endurance, positive shoulder abduction test, and negative Spurling's test. She has related pain and difficulty with heavy lifting including caregiving responsibilities, occupational responsibilities, and resting her am in her lap. She requires skilled PT services at this time to address relevant deficits and improve overall function.     OBJECTIVE IMPAIRMENTS: decreased activity tolerance, decreased endurance, decreased ROM, decreased strength, impaired UE functional use, improper body mechanics, postural dysfunction, and pain.   ACTIVITY LIMITATIONS: carrying, lifting, sleeping, and caring for others  PARTICIPATION LIMITATIONS: meal prep, cleaning, laundry, interpersonal relationship, driving, shopping, community activity, and occupation  PERSONAL FACTORS: Past/current experiences, Profession, Time since onset of injury/illness/exacerbation, 1 comorbidity: HTN, and Caregiving Responsibilities are also affecting patient's functional outcome.   REHAB POTENTIAL: Fair    CLINICAL DECISION MAKING: Stable/uncomplicated  EVALUATION COMPLEXITY: Low   GOALS: Goals reviewed with patient? YES  SHORT TERM GOALS: Target date: 07/24/2024   Patient will be independent with initial home program at least 3 days/week.  Baseline: provided at eval Goal Status: INITIAL   2.  Patient will demonstrate improved postural awareness for at least 15 minutes while seated without need for cueing from PT.  Baseline: see objective measures Goal Status: INITIAL   3.  Patient will demonstrate improved  BIL grip strength to at least 50lb bilaterally.  Goal status: INITIAL   LONG TERM GOALS: Target date: 08/21/2024    Patient will report improved overall functional ability with NDI score of 4 or less.  Baseline: 9/50 Goal Status: INITIAL    2.  Patient will demonstrate ability to perform floor to waist lifting of at least 25# using appropriate body mechanics and with no more than minimal pain in order to safely perform normal daily/occupational tasks.   Goal Status: INITIAL   3.  Patient will reporting decreased frequency of distal paresthesia, that is alleviated with HEP and position changes throughout the day, utilizing these as needed.  Baseline: reports distal paresthesia is present >75% of the day  Goal status: INITIAL  4.  Patient will report decreased R shoulder pain to 3/10 or less with daily activities, including care giving tasks for her husband.  Goal status: INITIAL     PLAN:  PT FREQUENCY: 1-2x/week  PT DURATION: 8 weeks  PLANNED INTERVENTIONS: 97164- PT Re-evaluation, 97750- Physical Performance Testing, 97110-Therapeutic exercises, 97530- Therapeutic activity, [id number]- Neuromuscular re-education, 97535- Self Care, 02859- Manual therapy, [id number]- Electrical stimulation (unattended), Patient/Family education, Taping, Joint mobilization, Spinal manipulation, Spinal mobilization, Cryotherapy, and Moist heat  PLAN FOR NEXT SESSION: cervical retraction, shoulder mobility, neural desensitization, UE strengthening    Marko Molt, PT, DPT  07/17/2024 9:12 AM

## 2024-07-24 ENCOUNTER — Ambulatory Visit

## 2024-07-28 ENCOUNTER — Ambulatory Visit: Admitting: Family Medicine

## 2024-07-31 ENCOUNTER — Encounter: Payer: Self-pay | Admitting: Physical Therapy

## 2024-07-31 ENCOUNTER — Ambulatory Visit: Payer: Self-pay | Admitting: Physical Therapy

## 2024-07-31 DIAGNOSIS — M5412 Radiculopathy, cervical region: Secondary | ICD-10-CM

## 2024-07-31 DIAGNOSIS — G8929 Other chronic pain: Secondary | ICD-10-CM

## 2024-07-31 NOTE — Therapy (Signed)
 OUTPATIENT PHYSICAL THERAPY TREATMENT   Patient Name: Jade Potts MRN: [id number] DOB:1976/08/10, 48 y.o., female Today's Date: 07/31/2024  END OF SESSION:  PT End of Session - 07/31/24 0839     Visit Number 4    Number of Visits 9    Date for PT Re-Evaluation 08/21/24    Authorization Type [employer]    PT Start Time 0845    PT Stop Time 0928    PT Time Calculation (min) 43 min            Past Medical History:  Diagnosis Date   Anemia    1st pregnancy   Gestational diabetes    1st and 2nd pregnancy   Hypertension    Insertion of Nexplanon  02/21/2012   02/21/2012   No pertinent past medical history    History reviewed. No pertinent surgical history. Patient Active Problem List   Diagnosis Date Noted   Insertion of Nexplanon  02/21/2012    PCP: Celestia Rosaline SQUIBB, NP  REFERRING PROVIDER: Teressa Rainell BROCKS, DO  REFERRING DIAG: Right cervical radiculopathy [M54.12]  Evaluate and treat for cervical radiculopathy and possible cubital tunnel syndrome.   THERAPY DIAG:  Radiculopathy, cervical region  Chronic right shoulder pain  Rationale for Evaluation and Treatment: Rehabilitation  ONSET DATE: 2 years   SUBJECTIVE:                                                                                                                                                                                                        Per eval: Patient reports to PT with neck and R UE pain that has been present for about 2 years. She states that most of the pain is in her shoulder. She doesn't recall any injuries.She reports that she also cares for her husband who is a paraplegic. She is the only one who can do the lifting to help him with transfers and mobility. So she states that even the painful/aggravating activities, she has to just do anyway. She does also note numbness in R hand that is present most of the time. She did have an injection a few months ago and it helps her  enough.   Hand dominance: Right  SUBJECTIVE STATEMENT: 07/31/2024: Video interpreter used : No pain on arrival. Sometimes do the exercises. Thinks they are helpful. Less pain with daily activities. Repetitive movements at work are when I feel the shoulder pain, points to anterior shoulder. 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 Numbness  is better   Patient reports that some days her pain is doing better and some days it is the same. She is having a hard time incorporating her HEP at home d/t spending most of the day running errands and caring for her husband.   PERTINENT HISTORY:  Relevant PMHx includes HTN  PAIN:  Are you having pain? 07/31/2024 see subjective above  Per eval:  Yes: NPRS scale: 0/10 current, 6/10 at worst  Pain location: R shoulder/UE Pain description: strong, squeezing, fingers are numbness  Aggravating factors: lifting, working, resting arm in her lap Relieving factors: tylenol     PRECAUTIONS: None  RED FLAGS: None     WEIGHT BEARING RESTRICTIONS: No  FALLS:  Has patient fallen in last 6 months? No  LIVING ENVIRONMENT: Lives with: lives with their family and lives with their spouse Lives in: House/apartment Stairs: No Has following equipment at home: None  OCCUPATION: packaging   PLOF: Independent  PATIENT GOALS: Not having any pain and the numbness subsides.   NEXT MD VISIT: 07/28/2024 with referring provider  OBJECTIVE:  Note: Objective measures were completed at Evaluation unless otherwise noted.  DIAGNOSTIC FINDINGS:  06/16/24 Cervical Spine X-Ray  FINDINGS: Straightening of normal cervical lordosis likely related to patient positioning or muscle spasm. Mild endplate spurring noted at C4-C5, C5-C6, C6-C7. Prevertebral soft tissues are normal thickness. Visualized lung apices are clear.   IMPRESSION: Mild multilevel degenerative changes of the cervical spine.  PATIENT  SURVEYS:  NDI: 9/50   COGNITION: Overall cognitive status: Within functional limits for tasks assessed  SENSATION: Not tested  POSTURE: rounded shoulders and forward head  PALPATION: Moderate tenderness to palpation along R UT, supraspinatus    CERVICAL ROM:   Passive ROM PROM (deg) eval  Flexion   Extension   Right lateral flexion   Left lateral flexion   Right rotation   Left rotation    (Blank rows = not tested)  UPPER EXTREMITY ROM:  Active ROM Right eval Left eval  Shoulder flexion    Shoulder extension    Shoulder abduction    Shoulder adduction    Shoulder extension    Shoulder internal rotation    Shoulder external rotation    Elbow flexion    Elbow extension    Wrist flexion    Wrist extension    Wrist ulnar deviation    Wrist radial deviation    Wrist pronation    Wrist supination     (Blank rows = not tested)  UPPER EXTREMITY MMT:  MMT Right eval Left eval Right 07/31/24 Left 07/31/24  Shoulder flexion 5 5    Shoulder extension      Shoulder abduction 4+ 4+    Shoulder adduction      Shoulder extension      Shoulder internal rotation 4+ 5    Shoulder external rotation 4+ 5    Middle trapezius      Lower trapezius      Elbow flexion 5 5    Elbow extension 5 5    Wrist flexion      Wrist extension      Wrist ulnar deviation      Wrist radial deviation      Wrist pronation      Wrist supination      Grip strength 45lb 47lb 43lb  38lb   (Blank rows = not tested)  CERVICAL SPECIAL TESTS:  Spurling's test: Negative; reports decreased shoulder pain Shoulder Abduction test: Positive, relief of symptoms  Patient also report relief of symptoms with elbow flexion vs extended elbow when resting arm in lap; additional pain relief with PA GHJ mob with >60 degrees of shoulder abduction while laying supine     TREATMENT DATE:   [facility] Adult PT Treatment:                                                DATE: 07/31/24  Neuromuscular  re-ed: Standing rows GTB x 20  Standing shoulder ext Bil x 20  Red band 3 way horizontal abd pulses 2x5 each cues for posture and control  Right D2 flexion , standing red band x 10  Modified plank, counter, shoulder taps x 10 BIL Seated BIL shoulder ER red band 2 x 10 Radial Nerve Flossing 2 x 10  Seated Scap retract  Seated chin tucks 2x10 emphasis on motor control and reduced compensations  Self Care: Seated Posture , alignment of spine  Bending over bedside, raising bed, hip hinge- will progress to lifting     [facility] Adult PT Treatment:                                                DATE: 07/17/2024  Neuromuscular re-ed: Seated chin tucks 2x10 emphasis on motor control and reduced compensations Red band 3 way horizontal abd pulses 2x5 each cues for posture and control  Seated BIL shoulder ER red band 2 x 5  Modified plank, counter, shoulder taps x8 BIL Standing rows  x19 Standing shoulder extension BIL x S10    Self-Care:  Patient education regarding strategies for HEP compliance/activity pacing throughout the day  Updated and reviewed HEP to focus on 3-4 activities max                                                                                                                   [facility] Adult PT Treatment:                                                DATE: 07/10/24  Neuromuscular re-ed: Radial nerve glides 2x10 w/ education/cueing to maximize tolerance Seated chin tucks x12 emphasis on motor control and reduced compensations Red band 3 way horizontal abd pulses x5 each cues for posture and control  Modified plank, counter, shoulder taps x8 BIL  Self Care: Education/discussion re: symptom behavior, relevant anatomy/physiology, rationale for interventions, strategy to modify symptoms  PATIENT EDUCATION:  Education details: rationale for  interventions, HEP  Person educated: Patient Education method: Explanation, Demonstration, Tactile cues, Verbal cues Education comprehension: verbalized understanding, returned demonstration, verbal cues required, tactile cues required, and needs further education     HOME EXERCISE PROGRAM: Access Code: [email or web address] URL: [email or web address] Date: 07/17/2024 Prepared by: Marko Molt  Exercises - Radial Nerve Flossing  - 1 x daily - 7 x weekly - 2 sets - 10 reps - Seated Cervical Retraction  - 1 x daily - 7 x weekly - 2 sets - 10 reps - 3 sec hold - Standing Shoulder Horizontal Abduction with Resistance  - 1 x daily - 7 x weekly - 3 sets - 5 reps - Standing Shoulder Diagonal Horizontal Abduction 60/120 Degrees with Resistance  - 1 x daily - 7 x weekly - 3 sets - 5 reps  ASSESSMENT:  CLINICAL IMPRESSION: 07/31/2024: Jade Potts had good tolerance of today's treatment session. She reports significant decrease in hand numbness noting that most of the time it is not present. LTG#  3 met. Grip strength has not improved. Will consider puddy exercise. She does not work on posture and education was provided today on its benefit.  Will continued to reinforce. She is minimally compliant with HEP. No STGs met. Worked on Hospital doctor education with bending over to care for husband.  We will continue to progress per POC as tolerated, in order to reach established rehab goals. Pain is mostly anterior shoulder.    Per eval: [place] Folden is a 48 y.o. female who was seen today for physical therapy evaluation and treatment for Neck Pain with Radicular Pain. She is demonstrating decreased R>L UE MMT, decreased postural endurance, positive shoulder abduction test, and negative Spurling's test. She has related pain and difficulty with heavy lifting including caregiving responsibilities, occupational responsibilities, and resting her am in her lap. She requires skilled PT services at this time to  address relevant deficits and improve overall function.     OBJECTIVE IMPAIRMENTS: decreased activity tolerance, decreased endurance, decreased ROM, decreased strength, impaired UE functional use, improper body mechanics, postural dysfunction, and pain.   ACTIVITY LIMITATIONS: carrying, lifting, sleeping, and caring for others  PARTICIPATION LIMITATIONS: meal prep, cleaning, laundry, interpersonal relationship, driving, shopping, community activity, and occupation  PERSONAL FACTORS: Past/current experiences, Profession, Time since onset of injury/illness/exacerbation, 1 comorbidity: HTN, and Caregiving Responsibilities are also affecting patient's functional outcome.   REHAB POTENTIAL: Fair    CLINICAL DECISION MAKING: Stable/uncomplicated  EVALUATION COMPLEXITY: Low   GOALS: Goals reviewed with patient? YES  SHORT TERM GOALS: Target date: 07/24/2024   Patient will be independent with initial home program at least 3 days/week.  Baseline: provided at eval 07/31/24: performs sometimes Goal Status: ONGOING  2.  Patient will demonstrate improved postural awareness for at least 15 minutes while seated without need for cueing from PT.  Baseline: see objective measures 07/31/24: admits to mostly slouching, poor posture, began education Goal Status: ONGOING  3.  Patient will demonstrate improved BIL grip strength to at least 50lb bilaterally.   07/31/24: see chart Goal status: ONGOING   LONG TERM GOALS: Target date: 08/21/2024    Patient will report improved overall functional ability with NDI score of 4 or less.  Baseline: 9/50 Goal Status: INITIAL    2.  Patient will demonstrate ability to perform floor to waist lifting of at least 25# using appropriate body mechanics and with no more than minimal pain in order to safely perform normal  daily/occupational tasks.   Goal Status: INITIAL   3.  Patient will reporting decreased frequency of distal paresthesia, that is alleviated  with HEP and position changes throughout the day, utilizing these as needed.  Baseline: reports distal paresthesia is present >75% of the day  07/31/24: improved, most of time I do not have numbness anymore  Goal status: MET   4.  Patient will report decreased R shoulder pain to 3/10 or less with daily activities, including care giving tasks for her husband.  Goal status: INITIAL     PLAN:  PT FREQUENCY: 1-2x/week  PT DURATION: 8 weeks  PLANNED INTERVENTIONS: 97164- PT Re-evaluation, 97750- Physical Performance Testing, 97110-Therapeutic exercises, 97530- Therapeutic activity, 97112- Neuromuscular re-education, 97535- Self Care, [id number]- Manual therapy, [id number]- Electrical stimulation (unattended), Patient/Family education, Taping, Joint mobilization, Spinal manipulation, Spinal mobilization, Cryotherapy, and Moist heat  PLAN FOR NEXT SESSION: cervical retraction, shoulder mobility, neural desensitization, UE strengthening , begin lifting , consider puddy for grip   Jade Potts, PTA 07/31/24 12:12 PM Phone: [phone] Fax: [phone]

## 2024-08-07 ENCOUNTER — Ambulatory Visit: Payer: Self-pay | Admitting: Physical Therapy

## 2024-08-07 ENCOUNTER — Telehealth: Payer: Self-pay | Admitting: Physical Therapy

## 2024-08-14 ENCOUNTER — Ambulatory Visit: Payer: Self-pay | Attending: Family Medicine | Admitting: Physical Therapy

## 2024-08-14 ENCOUNTER — Encounter: Payer: Self-pay | Admitting: Physical Therapy

## 2024-08-14 DIAGNOSIS — M5412 Radiculopathy, cervical region: Secondary | ICD-10-CM | POA: Insufficient documentation

## 2024-08-14 DIAGNOSIS — M25511 Pain in right shoulder: Secondary | ICD-10-CM | POA: Diagnosis present

## 2024-08-14 DIAGNOSIS — G8929 Other chronic pain: Secondary | ICD-10-CM | POA: Insufficient documentation

## 2024-09-08 ENCOUNTER — Ambulatory Visit: Admitting: Family Medicine

## 2024-09-08 ENCOUNTER — Encounter: Payer: Self-pay | Admitting: Family Medicine

## 2024-09-08 VITALS — BP 128/71 | Ht 60.0 in | Wt 178.0 lb

## 2024-09-08 DIAGNOSIS — M5412 Radiculopathy, cervical region: Secondary | ICD-10-CM | POA: Diagnosis present

## 2024-09-08 NOTE — Progress Notes (Signed)
 DATE OF VISIT: 09/08/2024        Jade Potts DOB: 21-Nov-1976 MRN: [id number]  CC:  f/u Rt cervical radiculopathy   History of present Illness: Jade Potts is a 48 y.o. female who presents for a follow-up visit  Spanish-speaking, visit completed with in person interpreter, [place] today   Last seen by me 06/16/24 for Cervical radiculopathy - was referred for xrays - was recommend PT - has completed 5 sessions of PT, last was 08/14/24 - PT notes from 08/14/24, 07/31/24, 07/17/24, 07/10/24 reviewed during visit today  Today she reports she is feeling much better No longer having pain Still having some intermittent numbness/tingling into the right hand, 2nd through 5th fingers, but much less Denies any weakness Is happy with her symptoms  Medications:  Outpatient Encounter Medications as of 09/08/2024  Medication Sig   benzonatate  (TESSALON ) 100 MG capsule Take 1 capsule (100 mg total) by mouth 3 (three) times daily as needed for cough.   cyclobenzaprine  (FLEXERIL ) 10 MG tablet Take 1 tablet (10 mg total) by mouth 2 (two) times daily as needed for muscle spasms.   meloxicam  (MOBIC ) 15 MG tablet Take 1 tablet (15 mg total) by mouth daily.   metFORMIN (GLUCOPHAGE) 500 MG tablet Take by mouth 2 (two) times daily with a meal.   promethazine -dextromethorphan (PROMETHAZINE -DM) 6.25-15 MG/5ML syrup Take 5 mLs by mouth 4 (four) times daily as needed for cough.   No facility-administered encounter medications on file as of 09/08/2024.    Allergies: is allergic to codeine and penicillins.  Physical Examination: Vitals: BP 128/71   Ht 5' (1.524 m)   Wt 178 lb (80.7 kg)   BMI 34.76 kg/m  GENERAL:  Jade Potts is a 48 y.o. female appearing their stated age, alert and oriented x 3, in no apparent distress.  SKIN: no rashes or lesions, skin clean, dry, intact MSK: Fine with full range of motion without pain.  No midline or paraspinal tenderness.  Negative Spurling's  bilaterally Shoulders/upper extremity: Full range of motion without pain.  Rotator cuff strength 5/5 throughout.  Normal bicep/tricep strength 5/5 throughout.  Normal grip strength bilaterally. NEURO: Slightly diminished sensation to light touch along the right second, third, fourth, fifth fingers, otherwise sensation intact to light touch upper extremity bilaterally, DTR 2/4 bicep, tricep, brachial radialis bilaterally VASC: pulses 2+ and symmetric radial artery bilaterally, no edema  Radiology: Right shoulder x-ray 06/16/2024 showing: IMPRESSION: Mild RIGHT acromioclavicular osteoarthrosis.  C-spine x-ray 06/16/2024 showing: FINDINGS: Straightening of normal cervical lordosis likely related to patient positioning or muscle spasm. Mild endplate spurring noted at C4-C5, C5-C6, C6-C7. Prevertebral soft tissues are normal thickness. Visualized lung apices are clear.   IMPRESSION: Mild multilevel degenerative changes of the cervical spine. 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  Discussed further treatment options including watchful waiting, ongoing home exercise program, ongoing PT, MRI of the cervical spine.  She would like to proceed with watchful waiting and home exercises.  If symptoms are worsening would consider MRI of the cervical spine to rule out pinched nerve - She will follow-up with me on an as needed basis   Patient expressed understanding & agreement with above.  Encounter Diagnosis  Name Primary?   Right  cervical radiculopathy Yes    No orders of the defined types were placed in this encounter.

## 2024-09-23 ENCOUNTER — Other Ambulatory Visit: Payer: Self-pay

## 2024-09-23 DIAGNOSIS — Z1231 Encounter for screening mammogram for malignant neoplasm of breast: Secondary | ICD-10-CM

## 2024-10-13 ENCOUNTER — Ambulatory Visit
Admission: RE | Admit: 2024-10-13 | Discharge: 2024-10-13 | Disposition: A | Discharge status: Home / Self Care | Source: Ambulatory Visit | Attending: Family | Admitting: Family

## 2024-10-13 DIAGNOSIS — Z1231 Encounter for screening mammogram for malignant neoplasm of breast: Secondary | ICD-10-CM
